# Patient Record
Sex: Male | Born: 1990 | Race: White | Hispanic: No | Marital: Married | Smoking: Former smoker
Health system: Southern US, Community
[De-identification: ages and names within clinical notes are randomized; demographics above are authoritative.]

## PROBLEM LIST (undated history)

## (undated) DIAGNOSIS — T7840XA Allergy, unspecified, initial encounter: Secondary | ICD-10-CM

## (undated) DIAGNOSIS — F419 Anxiety disorder, unspecified: Secondary | ICD-10-CM

## (undated) HISTORY — DX: Allergy, unspecified, initial encounter: T78.40XA

---
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
## 2014-01-26 ENCOUNTER — Telehealth: Payer: Self-pay | Admitting: Family Medicine

## 2014-01-27 NOTE — Telephone Encounter (Signed)
Appt given per patient request 

## 2014-03-31 ENCOUNTER — Ambulatory Visit: Payer: Self-pay | Admitting: Family Medicine

## 2015-12-04 ENCOUNTER — Ambulatory Visit (INDEPENDENT_AMBULATORY_CARE_PROVIDER_SITE_OTHER): Payer: BC Managed Care – PPO | Admitting: Family Medicine

## 2015-12-04 ENCOUNTER — Encounter (INDEPENDENT_AMBULATORY_CARE_PROVIDER_SITE_OTHER): Payer: Self-pay

## 2015-12-04 ENCOUNTER — Encounter: Payer: Self-pay | Admitting: Family Medicine

## 2015-12-04 VITALS — BP 114/71 | HR 68 | Temp 97.4°F | Ht 69.0 in | Wt 226.1 lb

## 2015-12-04 DIAGNOSIS — IMO0001 Reserved for inherently not codable concepts without codable children: Secondary | ICD-10-CM

## 2015-12-04 DIAGNOSIS — Z0000 Encounter for general adult medical examination without abnormal findings: Secondary | ICD-10-CM | POA: Diagnosis not present

## 2015-12-04 DIAGNOSIS — B359 Dermatophytosis, unspecified: Secondary | ICD-10-CM

## 2015-12-04 DIAGNOSIS — E663 Overweight: Secondary | ICD-10-CM

## 2015-12-04 LAB — URINALYSIS
BILIRUBIN UA: NEGATIVE
Glucose, UA: NEGATIVE
Ketones, UA: NEGATIVE
Leukocytes, UA: NEGATIVE
Nitrite, UA: NEGATIVE
PH UA: 7 (ref 5.0–7.5)
Protein, UA: NEGATIVE
RBC UA: NEGATIVE
SPEC GRAV UA: 1.02 (ref 1.005–1.030)
UUROB: 0.2 mg/dL (ref 0.2–1.0)

## 2015-12-04 MED ORDER — ECONAZOLE NITRATE 1 % EX CREA: TOPICAL_CREAM | Freq: Every day | CUTANEOUS | 0 refills | Status: DC

## 2015-12-04 NOTE — Patient Instructions (Signed)
It was great to meet you today. Thanks for coming in.  Remember to get to lose it app for your phone. Write down all of the food sheet every day and it will help you count your calories.  In 1 year we should repeat your physical. By then I'd like to see your weight at 185. You can accomplish this through multiple things we discussed such as increasing protein in your diet by eating more eggs whites in the morning. You can have [place] yogurt. Look for protein bars that have at least 1 g of protein for every 10 cal total. Avoid eating close to bedtime as much as possible. Try exercising for at least 1 hour 5 times a week. I'll of the fact that you are active in the gym. Lifting weights is great for young guys to trim down. You do need to add more cardio though.

## 2015-12-04 NOTE — Progress Notes (Signed)
Subjective:  Patient ID: Jackson Mitchell, male    DOB: 12-02-1990  Age: 25 y.o. MRN: [id number]  CC: New Patient (Initial Visit) (pt here to establish care and would like bloodwork because he has been told in the past his cholesterol levels were borderline high.)   HPI Farah Benish presents for CPE  History Lunden has a past medical history of Allergy.   He has no past surgical history on file.   His family history includes Alcohol abuse in his maternal grandmother; Arthritis in his father, maternal grandfather, and maternal grandmother; COPD in his father; Cancer in his maternal grandfather; Depression in his mother; Hyperlipidemia in his father and mother; Hypertension in his father.He reports that he quit smoking about 4 years ago. He quit smokeless tobacco use about 4 years ago. He reports that he drinks about 1.8 oz of alcohol per week . He reports that he does not use drugs.  No current outpatient prescriptions on file prior to visit.   No current facility-administered medications on file prior to visit.     ROS Review of Systems  Constitutional: Negative for activity change, appetite change, chills, diaphoresis, fatigue, fever and unexpected weight change.  HENT: Negative for congestion, ear pain, hearing loss, postnasal drip, rhinorrhea, sore throat, tinnitus and trouble swallowing.   Eyes: Negative for photophobia, pain, discharge and redness.  Respiratory: Negative for apnea, cough, choking, chest tightness, shortness of breath, wheezing and stridor.   Cardiovascular: Negative for chest pain, palpitations and leg swelling.  Gastrointestinal: Negative for abdominal distention, abdominal pain, blood in stool, constipation, diarrhea, nausea and vomiting.  Endocrine: Negative for cold intolerance, heat intolerance, polydipsia, polyphagia and polyuria.  Genitourinary: Negative for difficulty urinating, dysuria, enuresis, flank pain, frequency, genital sores, hematuria and  urgency.  Musculoskeletal: Negative for arthralgias and joint swelling.  Skin: Negative for color change, rash and wound.  Allergic/Immunologic: Negative for immunocompromised state.  Neurological: Negative for dizziness, tremors, seizures, syncope, facial asymmetry, speech difficulty, weakness, light-headedness, numbness and headaches.  Hematological: Does not bruise/bleed easily.  Psychiatric/Behavioral: Negative for agitation, behavioral problems, confusion, decreased concentration, dysphoric mood, hallucinations, sleep disturbance and suicidal ideas. The patient is not nervous/anxious and is not hyperactive.     Objective:  BP 114/71   Pulse 68   Temp 97.4 F (36.3 C) (Oral)   Ht '5\' 9"'  (1.753 m)   Wt 226 lb 2 oz (102.6 kg)   BMI 33.39 kg/m   Physical Exam  Constitutional: He is oriented to person, place, and time. He appears well-developed and well-nourished.  HENT:  Head: Normocephalic and atraumatic.  Mouth/Throat: Oropharynx is clear and moist.  Eyes: EOM are normal. Pupils are equal, round, and reactive to light.  Neck: Normal range of motion. No tracheal deviation present. No thyromegaly present.  Cardiovascular: Normal rate, regular rhythm and normal heart sounds.  Exam reveals no gallop and no friction rub.   No murmur heard. Pulmonary/Chest: Breath sounds normal. He has no wheezes. He has no rales.  Abdominal: Soft. He exhibits no mass. There is no tenderness.  Musculoskeletal: Normal range of motion. He exhibits no edema.  Neurological: He is alert and oriented to person, place, and time.  Skin: Skin is warm and dry. There is erythema (both cheeks, without telangectasia minimal papularity).  Psychiatric: He has a normal mood and affect.    Assessment & Plan:   Arjun was seen today for new patient (initial visit).  Diagnoses and all orders for this visit:  Well adult -  CBC with Differential/Platelet -     CMP14+EGFR -     Lipid panel -      Urinalysis  Overweight -     CBC with Differential/Platelet -     CMP14+EGFR -     Lipid panel -     Urinalysis -     TSH  Trichophyton rubrum causing tinea  Other orders -     econazole nitrate 1 % cream; Apply topically daily.   I am having Mr. Dirk start on econazole nitrate. I am also having him maintain his loratadine.  Meds ordered this encounter  Medications  . loratadine (CLARITIN) 10 MG tablet    Sig: Take 10 mg by mouth daily.  Marland Kitchen econazole nitrate 1 % cream    Sig: Apply topically daily.    Dispense:  15 g    Refill:  0   Remember to get to lose it app for your phone. Write down all of the food sheet every day and it will help you count your calories.  In 1 year we should repeat your physical. By then I'd like to see your weight at 185. You can accomplish this through multiple things we discussed such as increasing protein in your diet by eating more eggs whites in the morning. You can have [place] yogurt. Look for protein bars that have at least 1 g of protein for every 10 cal total. Avoid eating close to bedtime as much as possible. Try exercising for at least 1 hour 5 times a week. I'll of the fact that you are active in the gym. Lifting weights is great for young guys to trim down. You do need to add more cardio though.  Follow-up: Return in about 1 year (around 12/03/2016).  Claretta Fraise, M.D.

## 2015-12-05 LAB — CMP14+EGFR
A/G RATIO: 1.6 (ref 1.2–2.2)
ALT: 46 IU/L — ABNORMAL HIGH (ref 0–44)
AST: 27 IU/L (ref 0–40)
Albumin: 4.5 g/dL (ref 3.5–5.5)
Alkaline Phosphatase: 125 IU/L — ABNORMAL HIGH (ref 39–117)
BILIRUBIN TOTAL: 0.2 mg/dL (ref 0.0–1.2)
BUN/Creatinine Ratio: 13 (ref 9–20)
BUN: 12 mg/dL (ref 6–20)
CHLORIDE: 102 mmol/L (ref 96–106)
CO2: 26 mmol/L (ref 18–29)
CREATININE: 0.94 mg/dL (ref 0.76–1.27)
Calcium: 9.5 mg/dL (ref 8.7–10.2)
GFR calc Af Amer: 132 mL/min/{1.73_m2} (ref >59)
GFR calc non Af Amer: 114 mL/min/{1.73_m2} (ref >59)
GLOBULIN, TOTAL: 2.8 g/dL (ref 1.5–4.5)
Glucose: 89 mg/dL (ref 65–99)
POTASSIUM: 4.9 mmol/L (ref 3.5–5.2)
SODIUM: 142 mmol/L (ref 134–144)
Total Protein: 7.3 g/dL (ref 6.0–8.5)

## 2015-12-05 LAB — CBC WITH DIFFERENTIAL/PLATELET
Basophils Absolute: 0 10*3/uL (ref 0.0–0.2)
Basos: 0 %
EOS (ABSOLUTE): 0.1 10*3/uL (ref 0.0–0.4)
EOS: 1 %
HEMATOCRIT: 44.3 % (ref 37.5–51.0)
Hemoglobin: 14.5 g/dL (ref 12.6–17.7)
Immature Grans (Abs): 0 10*3/uL (ref 0.0–0.1)
Immature Granulocytes: 0 %
LYMPHS ABS: 3 10*3/uL (ref 0.7–3.1)
LYMPHS: 24 %
MCH: 27.1 pg (ref 26.6–33.0)
MCHC: 32.7 g/dL (ref 31.5–35.7)
MCV: 83 fL (ref 79–97)
MONOCYTES: 6 %
MONOS ABS: 0.8 10*3/uL (ref 0.1–0.9)
NEUTROS ABS: 8.7 10*3/uL — AB (ref 1.4–7.0)
Neutrophils: 69 %
Platelets: 348 10*3/uL (ref 150–379)
RBC: 5.35 x10E6/uL (ref 4.14–5.80)
RDW: 14.6 % (ref 12.3–15.4)
WBC: 12.7 10*3/uL — AB (ref 3.4–10.8)

## 2015-12-05 LAB — LIPID PANEL
CHOLESTEROL TOTAL: 168 mg/dL (ref 100–199)
Chol/HDL Ratio: 3.7 ratio units (ref 0.0–5.0)
HDL: 45 mg/dL (ref >39)
LDL Calculated: 111 mg/dL — ABNORMAL HIGH (ref 0–99)
TRIGLYCERIDES: 59 mg/dL (ref 0–149)
VLDL Cholesterol Cal: 12 mg/dL (ref 5–40)

## 2015-12-05 LAB — TSH: TSH: 1.55 u[IU]/mL (ref 0.450–4.500)

## 2015-12-28 ENCOUNTER — Ambulatory Visit (INDEPENDENT_AMBULATORY_CARE_PROVIDER_SITE_OTHER): Payer: BC Managed Care – PPO | Admitting: *Deleted

## 2015-12-28 DIAGNOSIS — Z23 Encounter for immunization: Secondary | ICD-10-CM

## 2015-12-28 NOTE — Progress Notes (Signed)
Pt given Tdap Tolerated well 

## 2016-03-17 HISTORY — PX: WISDOM TOOTH EXTRACTION: SHX21

## 2016-04-04 ENCOUNTER — Ambulatory Visit (INDEPENDENT_AMBULATORY_CARE_PROVIDER_SITE_OTHER): Payer: BC Managed Care – PPO | Admitting: Physician Assistant

## 2016-04-04 ENCOUNTER — Encounter: Payer: Self-pay | Admitting: Physician Assistant

## 2016-04-04 VITALS — BP 124/89 | HR 86 | Temp 98.0°F | Ht 69.0 in | Wt 225.0 lb

## 2016-04-04 DIAGNOSIS — R52 Pain, unspecified: Secondary | ICD-10-CM

## 2016-04-04 DIAGNOSIS — J0110 Acute frontal sinusitis, unspecified: Secondary | ICD-10-CM | POA: Diagnosis not present

## 2016-04-04 DIAGNOSIS — J029 Acute pharyngitis, unspecified: Secondary | ICD-10-CM

## 2016-04-04 LAB — VERITOR FLU A/B WAIVED
Influenza A: NEGATIVE
Influenza B: NEGATIVE

## 2016-04-04 LAB — RAPID STREP SCREEN (MED CTR MEBANE ONLY): STREP GP A AG, IA W/REFLEX: NEGATIVE

## 2016-04-04 LAB — CULTURE, GROUP A STREP

## 2016-04-04 MED ORDER — AZITHROMYCIN 250 MG PO TABS: ORAL_TABLET | ORAL | 0 refills | Status: DC

## 2016-04-04 NOTE — Patient Instructions (Signed)
sinusi Sinusitis, Adult Sinusitis is soreness and inflammation of your sinuses. Sinuses are hollow spaces in the bones around your face. They are located:  Around your eyes.  In the middle of your forehead.  Behind your nose.  In your cheekbones. Your sinuses and nasal passages are lined with a stringy fluid (mucus). Mucus normally drains out of your sinuses. When your nasal tissues get inflamed or swollen, the mucus can get trapped or blocked so air cannot flow through your sinuses. This lets bacteria, viruses, and funguses grow, and that leads to infection. Follow these instructions at home: Medicines  Take, use, or apply over-the-counter and prescription medicines only as told by your doctor. These may include nasal sprays.  If you were prescribed an antibiotic medicine, take it as told by your doctor. Do not stop taking the antibiotic even if you start to feel better. Hydrate and Humidify  Drink enough water to keep your pee (urine) clear or pale yellow.  Use a cool mist humidifier to keep the humidity level in your home above 50%.  Breathe in steam for 10-15 minutes, 3-4 times a day or as told by your doctor. You can do this in the bathroom while a hot shower is running.  Try not to spend time in cool or dry air. Rest  Rest as much as possible.  Sleep with your head raised (elevated).  Make sure to get enough sleep each night. General instructions  Put a warm, moist washcloth on your face 3-4 times a day or as told by your doctor. This will help with discomfort.  Wash your hands often with soap and water. If there is no soap and water, use hand sanitizer.  Do not smoke. Avoid being around people who are smoking (secondhand smoke).  Keep all follow-up visits as told by your doctor. This is important. Contact a doctor if:  You have a fever.  Your symptoms get worse.  Your symptoms do not get better within 10 days. Get help right away if:  You have a very bad  headache.  You cannot stop throwing up (vomiting).  You have pain or swelling around your face or eyes.  You have trouble seeing.  You feel confused.  Your neck is stiff.  You have trouble breathing. This information is not intended to replace advice given to you by your health care provider. Make sure you discuss any questions you have with your health care provider. Document Released: 08/20/2007 Document Revised: 10/28/2015 Document Reviewed: 12/27/2014 Elsevier Interactive Patient Education  2017 [employer]

## 2016-04-04 NOTE — Progress Notes (Signed)
         Jackson EdelmanBradley Mitchell is a 26 y.o. male who presents for evaluation of sinus pain. Symptoms include: congestion, cough, facial pain, post nasal drip and sinus pressure. Onset of symptoms was 4 days ago. Symptoms have been gradually improving since that time. Past history is significant for occasional episodes of bronchitis. Patient is a non-smoker.  The following portions of the patient's history were reviewed and updated as appropriate: allergies, current medications, past family history, past medical history, past social history, past surgical history and problem list.  Review of Systems Constitutional: positive for chills, fatigue and malaise Eyes: positive for redness Ears, nose, mouth, throat, and face: positive for ear drainage, nasal congestion, sore mouth, sore throat and voice change Respiratory: positive for cough Cardiovascular: negative   Objective:    General appearance: mild distress Head: Normocephalic, without obvious abnormality, atraumatic Eyes: conjunctivae/corneas clear. PERRL, EOM's intact. Fundi benign. Ears: abnormal TM right ear - serous middle ear fluid Nose: Nares normal. Septum midline. Mucosa normal. No drainage or sinus tenderness., mild congestion, turbinates swollen, sinus tenderness bilateral Throat: abnormal findings: moderate oropharyngeal erythema Lungs: clear to auscultation bilaterally and normal percussion bilaterally Chest wall: no tenderness Heart: regular rate and rhythm, S1, S2 normal, no murmur, click, rub or gallop    Assessment:    Acute bacterial sinusitis.    Plan:  1. Body aches - Veritor Flu A/B Waived  2. Sore throat - Rapid strep screen (not at [facility])  3. Acute non-recurrent frontal sinusitis Zithromax #1 as directed   Nasal saline sprays. Nasal steroids per medication orders.

## 2016-05-28 ENCOUNTER — Encounter: Payer: Self-pay | Admitting: Family Medicine

## 2016-05-28 ENCOUNTER — Ambulatory Visit (INDEPENDENT_AMBULATORY_CARE_PROVIDER_SITE_OTHER): Payer: BC Managed Care – PPO | Admitting: Family Medicine

## 2016-05-28 VITALS — BP 138/85 | HR 76 | Temp 97.9°F | Ht 69.0 in | Wt 214.4 lb

## 2016-05-28 DIAGNOSIS — G5702 Lesion of sciatic nerve, left lower limb: Secondary | ICD-10-CM

## 2016-05-28 MED ORDER — PREDNISONE 20 MG PO TABS: ORAL_TABLET | ORAL | 0 refills | Status: DC

## 2016-05-28 MED ORDER — CYCLOBENZAPRINE HCL 10 MG PO TABS: 10.0000 mg | ORAL_TABLET | Freq: Three times a day (TID) | ORAL | 0 refills | Status: DC | PRN

## 2016-05-28 NOTE — Progress Notes (Signed)
BP 138/85   Pulse 76   Temp 97.9 F (36.6 C) (Oral)   Ht 5\' 9"  (1.753 m)   Wt 214 lb 6 oz (97.2 kg)   BMI 31.66 kg/m    Subjective:    Patient ID: Jackson Mitchell, male    DOB: 08/03/1990, 26 y.o.   MRN: [id number]  HPI: Jackson Mitchell is a 26 y.o. male presenting on 05/28/2016 for Back Pain (lower back pain, radiating into left leg; began 2 weeks ago)   HPI Low back pain and left hamstring pain Patient has low back pain and left hamstring pain that has been going on for the past 2 weeks. It started in his lower back but now is worse on his left lateral hamstring. He denies any fevers or chills or numbness or weakness. The pain has not been worsening but he feels like it is not improving either. He has been using Advil and Aleve which has been helping some. He denies any numbness or weakness.  Relevant past medical, surgical, family and social history reviewed and updated as indicated. Interim medical history since our last visit reviewed. Allergies and medications reviewed and updated.  Review of Systems  Constitutional: Negative for chills and fever.  Respiratory: Negative for shortness of breath and wheezing.   Cardiovascular: Negative for chest pain and leg swelling.  Gastrointestinal: Negative for abdominal pain.  Musculoskeletal: Positive for back pain and myalgias. Negative for gait problem.  Skin: Negative for rash.  All other systems reviewed and are negative.   Per HPI unless specifically indicated above   Allergies as of 05/28/2016      Reactions   Penicillins Rash      Medication List       Accurate as of 05/28/16  1:00 PM. Always use your most recent med list.          cyclobenzaprine 10 MG tablet Commonly known as:  FLEXERIL Take 1 tablet (10 mg total) by mouth 3 (three) times daily as needed for muscle spasms.   econazole nitrate 1 % cream Apply topically daily.   ibuprofen 400 MG tablet Commonly known as:  ADVIL,MOTRIN Take 400 mg by mouth every  6 (six) hours as needed.   loratadine 10 MG tablet Commonly known as:  CLARITIN Take 10 mg by mouth daily.   predniSONE 20 MG tablet Commonly known as:  DELTASONE 2 po at same time daily for 5 days          Objective:    BP 138/85   Pulse 76   Temp 97.9 F (36.6 C) (Oral)   Ht 5\' 9"  (1.753 m)   Wt 214 lb 6 oz (97.2 kg)   BMI 31.66 kg/m   Wt Readings from Last 3 Encounters:  05/28/16 214 lb 6 oz (97.2 kg)  04/04/16 225 lb (102.1 kg)  12/04/15 226 lb 2 oz (102.6 kg)    Physical Exam  Constitutional: He is oriented to person, place, and time. He appears well-developed and well-nourished. No distress.  Eyes: Conjunctivae are normal. Right eye exhibits no discharge. No scleral icterus.  Musculoskeletal: Normal range of motion. He exhibits no edema.       Lumbar back: He exhibits normal range of motion, no tenderness, no bony tenderness, no swelling and no edema.       Legs: Neurological: He is alert and oriented to person, place, and time. Coordination normal.  Skin: Skin is warm and dry. No rash noted. He is not diaphoretic.  Psychiatric:  He has a normal mood and affect. His behavior is normal.  Nursing note and vitals reviewed.     Assessment & Plan:   Problem List Items Addressed This Visit    None    Visit Diagnoses    Sciatic nerve disease, left    -  Primary   possible started as sciatica and ended up as IT band syndrome   Relevant Medications   predniSONE (DELTASONE) 20 MG tablet   cyclobenzaprine (FLEXERIL) 10 MG tablet       Follow up plan: Return if symptoms worsen or fail to improve.  Counseling provided for all of the vaccine components No orders of the defined types were placed in this encounter.   Arville Care, MD [facility] 05/28/2016, 1:00 PM

## 2016-06-06 ENCOUNTER — Encounter: Payer: Self-pay | Admitting: Family Medicine

## 2016-06-06 MED ORDER — PREDNISONE 20 MG PO TABS: ORAL_TABLET | ORAL | 0 refills | Status: DC

## 2016-07-02 ENCOUNTER — Encounter: Payer: Self-pay | Admitting: Family Medicine

## 2016-07-02 ENCOUNTER — Ambulatory Visit (INDEPENDENT_AMBULATORY_CARE_PROVIDER_SITE_OTHER): Payer: BC Managed Care – PPO

## 2016-07-02 ENCOUNTER — Ambulatory Visit (INDEPENDENT_AMBULATORY_CARE_PROVIDER_SITE_OTHER): Payer: BC Managed Care – PPO | Admitting: Family Medicine

## 2016-07-02 VITALS — BP 128/88 | HR 69 | Temp 97.9°F | Ht 68.25 in | Wt 215.0 lb

## 2016-07-02 DIAGNOSIS — Z0000 Encounter for general adult medical examination without abnormal findings: Secondary | ICD-10-CM

## 2016-07-02 DIAGNOSIS — Z111 Encounter for screening for respiratory tuberculosis: Secondary | ICD-10-CM | POA: Diagnosis not present

## 2016-07-02 DIAGNOSIS — E669 Obesity, unspecified: Secondary | ICD-10-CM | POA: Insufficient documentation

## 2016-07-02 DIAGNOSIS — M5442 Lumbago with sciatica, left side: Secondary | ICD-10-CM

## 2016-07-02 DIAGNOSIS — Z6831 Body mass index (BMI) 31.0-31.9, adult: Secondary | ICD-10-CM

## 2016-07-02 LAB — URINALYSIS
Bilirubin, UA: NEGATIVE
GLUCOSE, UA: NEGATIVE
KETONES UA: NEGATIVE
LEUKOCYTES UA: NEGATIVE
Nitrite, UA: NEGATIVE
PROTEIN UA: NEGATIVE
RBC, UA: NEGATIVE
Specific Gravity, UA: 1.02 (ref 1.005–1.030)
Urobilinogen, Ur: 0.2 mg/dL (ref 0.2–1.0)
pH, UA: 5 (ref 5.0–7.5)

## 2016-07-02 NOTE — Progress Notes (Signed)
BP 128/88   Pulse 69   Temp 97.9 F (36.6 C) (Oral)   Ht 5' 8.25" (1.734 m)   Wt 215 lb (97.5 kg)   BMI 32.45 kg/m    Subjective:    Patient ID: Jackson Mitchell, male    DOB: [birth year], 26 y.o.   MRN: [id number]  HPI: Jackson Mitchell is a 26 y.o. 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 He denies any major health issues except the low back pain that he was having previously that does radiate down his left leg, he denies any numbness or weakness. It only radiates down to just behind the knee. Patient denies any chest pain, shortness of breath, headaches or vision issues, abdominal complaints, diarrhea, nausea, vomiting, or joint issues.   Relevant past medical, surgical, family and social history reviewed and updated as indicated. Interim medical history since our last visit reviewed. Allergies and medications reviewed and updated.  Review of Systems  Constitutional: Negative for chills and fever.  HENT: Negative for ear pain and tinnitus.   Eyes: Negative for pain and discharge.  Respiratory: Negative for cough, shortness of breath and wheezing.   Cardiovascular: Negative for chest pain, palpitations and leg swelling.  Gastrointestinal: Negative for abdominal pain, blood in stool, constipation and diarrhea.  Genitourinary: Negative for dysuria and hematuria.  Musculoskeletal: Positive for back pain. Negative for gait problem and myalgias.  Skin: Negative for rash.  Neurological: Negative for dizziness, weakness, numbness and headaches.  Psychiatric/Behavioral: Negative for suicidal ideas.  All other systems reviewed and are negative.   Per HPI unless specifically indicated above   Allergies as of 07/02/2016      Reactions   Penicillins Rash      Medication List       Accurate as  of 07/02/16 11:20 AM. Always use your most recent med list.          cyclobenzaprine 10 MG tablet Commonly known as:  FLEXERIL Take 1 tablet (10 mg total) by mouth 3 (three) times daily as needed for muscle spasms.   econazole nitrate 1 % cream Apply topically daily.   ibuprofen 400 MG tablet Commonly known as:  ADVIL,MOTRIN Take 400 mg by mouth every 6 (six) hours as needed.   loratadine 10 MG tablet Commonly known as:  CLARITIN Take 10 mg by mouth daily.          Objective:    BP 128/88   Pulse 69   Temp 97.9 F (36.6 C) (Oral)   Ht 5' 8.25" (1.734 m)   Wt 215 lb (97.5 kg)   BMI 32.45 kg/m   Wt Readings from Last 3 Encounters:  07/02/16 215 lb (97.5 kg)  05/28/16 214 lb 6 oz (97.2 kg)  04/04/16 225 lb (102.1 kg)    Physical Exam  Constitutional: He is oriented to person, place, and time. He appears well-developed and well-nourished. No distress.  HENT:  Right Ear: External ear normal.  Left Ear: External ear normal.  Nose: Nose normal.  Mouth/Throat: Oropharynx is clear and moist. No oropharyngeal exudate.  Eyes: Conjunctivae and EOM are normal. Pupils are equal, round, and reactive to light. Right eye exhibits no discharge. No scleral icterus.  Neck: Neck supple. No thyromegaly present.  Cardiovascular: Normal rate, regular rhythm, normal heart sounds and  intact distal pulses.   No murmur heard. Pulmonary/Chest: Effort normal and breath sounds normal. No respiratory distress. He has no wheezes.  Abdominal: Soft. Bowel sounds are normal. He exhibits no distension. There is no tenderness. There is no rebound and no guarding.  Musculoskeletal: Normal range of motion. He exhibits no edema.       Back:  Lymphadenopathy:    He has no cervical adenopathy.  Neurological: He is alert and oriented to person, place, and time. Coordination normal.  Skin: Skin is warm and dry. No rash noted. He is not diaphoretic.  Psychiatric: He has a normal mood and affect. His  behavior is normal.  Vitals reviewed.   Lumbar x-ray: No signs of narrowing or acute bony abnormalities, with final read by radiologist.  Urine dip: Normal    Assessment & Plan:   Problem List Items Addressed This Visit      Other   Class 1 obesity without serious comorbidity with body mass index (BMI) of 31.0 to 31.9 in adult    Other Visit Diagnoses    Well adult exam    -  Primary   Physical for work, requires urinalysis   Relevant Orders   Urinalysis   Acute left-sided low back pain with left-sided sciatica       Relevant Orders   DG Lumbar Spine 2-3 Views   Encounter for TB tine test       Relevant Orders   PPD (Completed)       Follow up plan: Return if symptoms worsen or fail to improve.  Counseling provided for all of the vaccine components Orders Placed This Encounter  Procedures  . DG Lumbar Spine 2-3 Views  . Urinalysis    Arville Care, MD [facility] 07/02/2016, 11:20 AM

## 2016-07-14 ENCOUNTER — Encounter: Payer: Self-pay | Admitting: Family Medicine

## 2016-07-30 ENCOUNTER — Other Ambulatory Visit: Payer: Self-pay

## 2016-07-30 ENCOUNTER — Telehealth: Payer: Self-pay | Admitting: Family Medicine

## 2016-07-30 DIAGNOSIS — M5440 Lumbago with sciatica, unspecified side: Secondary | ICD-10-CM

## 2016-07-30 NOTE — Telephone Encounter (Signed)
Detailed message left for pt

## 2016-08-07 ENCOUNTER — Ambulatory Visit (HOSPITAL_COMMUNITY): Payer: BC Managed Care – PPO

## 2016-09-11 ENCOUNTER — Encounter: Payer: Self-pay | Admitting: Family Medicine

## 2016-09-11 MED ORDER — PREDNISONE 20 MG PO TABS: ORAL_TABLET | ORAL | 0 refills | Status: DC

## 2016-09-11 MED ORDER — MELOXICAM 15 MG PO TABS: 15.0000 mg | ORAL_TABLET | Freq: Every day | ORAL | 1 refills | Status: DC

## 2016-09-11 NOTE — Telephone Encounter (Signed)
Patient also low back pain, sent in prednisone and meloxicam for him, please do not start until prednisone is finished Arville CareJoshua Dettinger, MD [facility] 09/11/2016, 1:08 PM

## 2017-01-14 ENCOUNTER — Encounter: Payer: Self-pay | Admitting: Family Medicine

## 2017-01-14 MED ORDER — PREDNISONE 20 MG PO TABS: ORAL_TABLET | ORAL | 0 refills | Status: DC

## 2017-05-13 ENCOUNTER — Ambulatory Visit: Payer: BC Managed Care – PPO | Admitting: Pediatrics

## 2017-05-13 ENCOUNTER — Encounter: Payer: Self-pay | Admitting: Pediatrics

## 2017-05-13 VITALS — BP 123/84 | HR 88 | Temp 97.2°F | Ht 68.25 in | Wt 207.8 lb

## 2017-05-13 DIAGNOSIS — M5442 Lumbago with sciatica, left side: Secondary | ICD-10-CM | POA: Diagnosis not present

## 2017-05-13 DIAGNOSIS — G8929 Other chronic pain: Secondary | ICD-10-CM

## 2017-05-13 NOTE — Patient Instructions (Signed)
Back Exercises If you have pain in your back, do these exercises 2-3 times each day or as told by your doctor. When the pain goes away, do the exercises once each day, but repeat the steps more times for each exercise (do more repetitions). If you do not have pain in your back, do these exercises once each day or as told by your doctor. Exercises Single Knee to Chest  Do these steps 3-5 times in a row for each leg: 1. Lie on your back on a firm bed or the floor with your legs stretched out. 2. Bring one knee to your chest. 3. Hold your knee to your chest by grabbing your knee or thigh. 4. Pull on your knee until you feel a gentle stretch in your lower back. 5. Keep doing the stretch for 10-30 seconds. 6. Slowly let go of your leg and straighten it.  Pelvic Tilt  Do these steps 5-10 times in a row: 1. Lie on your back on a firm bed or the floor with your legs stretched out. 2. Bend your knees so they point up to the ceiling. Your feet should be flat on the floor. 3. Tighten your lower belly (abdomen) muscles to press your lower back against the floor. This will make your tailbone point up to the ceiling instead of pointing down to your feet or the floor. 4. Stay in this position for 5-10 seconds while you gently tighten your muscles and breathe evenly.  Cat-Cow  Do these steps until your lower back bends more easily: 1. Get on your hands and knees on a firm surface. Keep your hands under your shoulders, and keep your knees under your hips. You may put padding under your knees. 2. Let your head hang down, and make your tailbone point down to the floor so your lower back is round like the back of a cat. 3. Stay in this position for 5 seconds. 4. Slowly lift your head and make your tailbone point up to the ceiling so your back hangs low (sags) like the back of a cow. 5. Stay in this position for 5 seconds.  Press-Ups  Do these steps 5-10 times in a row: 1. Lie on your belly (face-down)  on the floor. 2. Place your hands near your head, about shoulder-width apart. 3. While you keep your back relaxed and keep your hips on the floor, slowly straighten your arms to raise the top half of your body and lift your shoulders. Do not use your back muscles. To make yourself more comfortable, you may change where you place your hands. 4. Stay in this position for 5 seconds. 5. Slowly return to lying flat on the floor.  Bridges  Do these steps 10 times in a row: 1. Lie on your back on a firm surface. 2. Bend your knees so they point up to the ceiling. Your feet should be flat on the floor. 3. Tighten your butt muscles and lift your butt off of the floor until your waist is almost as high as your knees. If you do not feel the muscles working in your butt and the back of your thighs, slide your feet 1-2 inches farther away from your butt. 4. Stay in this position for 3-5 seconds. 5. Slowly lower your butt to the floor, and let your butt muscles relax.  If this exercise is too easy, try doing it with your arms crossed over your chest. Back Lifts Do these steps 5-10 times in a   row: 1. Lie on your belly (face-down) with your arms at your sides, and rest your forehead on the floor. 2. Tighten the muscles in your legs and your butt. 3. Slowly lift your chest off of the floor while you keep your hips on the floor. Keep the back of your head in line with the curve in your back. Look at the floor while you do this. 4. Stay in this position for 3-5 seconds. 5. Slowly lower your chest and your face to the floor.  Contact a doctor if:  Your back pain gets a lot worse when you do an exercise.  Your back pain does not lessen 2 hours after you exercise. If you have any of these problems, stop doing the exercises. Do not do them again unless your doctor says it is okay. Get help right away if:  You have sudden, very bad back pain. If this happens, stop doing the exercises. Do not do them again  unless your doctor says it is okay. This information is not intended to replace advice given to you by your health care provider. Make sure you discuss any questions you have with your health care provider. Document Released: 04/05/2010 Document Revised: 08/09/2015 Document Reviewed: 04/27/2014 Elsevier Interactive Patient Education  2018 [employer]   

## 2017-05-13 NOTE — Progress Notes (Signed)
  Subjective:   Patient ID: Jackson Mitchell, male    DOB: 08/06/1990, 27 y.o.   MRN: [id number] CC: Back Pain (Lower, 1 year)  HPI: Jackson Mitchell is a 27 y.o. male presenting for Back Pain (Lower, 1 year)  Last year had mid back pain, tingling went down L leg, came and went for 3 months.   Tried on prednisone with some improvement. Flexeril didn't help. Recently taking ibuprofen on days off of work, 600mg  2-3 times a day. On work days takes 2 [employer] fast streamer[employer] aleve.   Has been seeing chiropractor regularly for past few months. Thinks helping some but not getting him back to normal. 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  Has a hard time getting out of bed in the morning because of stiffness in his back. Has not had the pain/tingling down L leg return. L hamstrings very tight.  Has been able to exercise regularly, less than several months ago with new baby at home, using treadmill, weight lifting.   Relevant past medical, surgical, family and social history reviewed. Allergies and medications reviewed and updated. Social History   Tobacco Use  Smoking Status Former Smoker  . Last attempt to quit: 12/04/2011  . Years since quitting: 5.4  Smokeless Tobacco Former NeurosurgeonUser  . Quit date: 12/04/2011   ROS: Per HPI   Objective:    BP 123/84   Pulse 88   Temp (!) 97.2 F (36.2 C) (Oral)   Ht 5' 8.25" (1.734 m)   Wt 207 lb 12.8 oz (94.3 kg)   BMI 31.36 kg/m   Wt Readings from Last 3 Encounters:  05/13/17 207 lb 12.8 oz (94.3 kg)  07/02/16 215 lb (97.5 kg)  05/28/16 214 lb 6 oz (97.2 kg)    Gen: NAD, alert, cooperative with exam, NCAT EYES: EOMI, no conjunctival injection, or no icterus CV: NRRR, normal S1/S2, no murmur, distal pulses 2+ b/l Resp: CTABL, no wheezes, normal WOB Abd: +BS, soft, NTND. no guarding or organomegaly Ext: No edema, warm Neuro: Alert and oriented, sensation intact b/l LE MSK: neg SLR b/l. Very tight  hamstring L leg. Strength 5/5 equal b/l knee ext/flex, hip flexion. No point tenderness over spine.  Assessment & Plan:  Jackson Mitchell was seen today for back pain.  Diagnoses and all orders for this visit:  Chronic midline low back pain with left-sided sciatica Rest, NSAIDs, ice, heat, gentle stretching, referral to PT.  -     Ambulatory referral to Physical Therapy -     Ambulatory referral to Orthopedic Surgery   Follow up plan: Return in about 2 months (around 07/11/2017). Rex Krasarol Vincent, MD [facility]

## 2017-05-20 ENCOUNTER — Ambulatory Visit: Payer: BC Managed Care – PPO | Admitting: Physical Therapy

## 2017-05-21 ENCOUNTER — Encounter: Payer: BC Managed Care – PPO | Admitting: Family Medicine

## 2017-05-25 ENCOUNTER — Encounter: Payer: Self-pay | Admitting: Physical Therapy

## 2017-05-25 ENCOUNTER — Ambulatory Visit (INDEPENDENT_AMBULATORY_CARE_PROVIDER_SITE_OTHER): Payer: BC Managed Care – PPO | Admitting: Orthopedic Surgery

## 2017-05-25 ENCOUNTER — Ambulatory Visit: Payer: BC Managed Care – PPO | Attending: Pediatrics | Admitting: Physical Therapy

## 2017-05-25 DIAGNOSIS — G8929 Other chronic pain: Secondary | ICD-10-CM | POA: Diagnosis present

## 2017-05-25 DIAGNOSIS — M5442 Lumbago with sciatica, left side: Secondary | ICD-10-CM | POA: Insufficient documentation

## 2017-05-25 DIAGNOSIS — M6281 Muscle weakness (generalized): Secondary | ICD-10-CM | POA: Insufficient documentation

## 2017-05-25 NOTE — Patient Instructions (Signed)
   Otniel Hoe, PT, DPT [facility] [address] [place], [place], [address] Phone: [phone]   Fax:  [phone]  

## 2017-05-25 NOTE — Therapy (Addendum)
[facility] [address] [place], [place], [address] Phone: [phone]   Fax:  [phone]  Physical Therapy Evaluation  Patient Details  Name: Jackson Jackson Mitchell MRN: [id number] Date of Birth: 1990-11-08 Referring Provider: Rex Kras   Encounter Date: 05/25/2017  PT End of Session - 05/25/17 0920    Visit Number  1    Number of Visits  18    Date for PT Re-Evaluation  07/06/17    Authorization Type  FOTO every 5th visit    PT Start Time  0817    PT Stop Time  0903    PT Time Calculation (min)  46 min    Activity Tolerance  Patient tolerated treatment well    Behavior During Therapy  [name] for tasks assessed/performed       Past Medical History:  Diagnosis Date  . Allergy    pt has a hx of taking allergy injections for 5 years. Last time he had injection was 2008.    History reviewed. No pertinent surgical history.  There were no vitals filed for this visit.   Subjective Assessment - 05/25/17 2047    Subjective  Patient reports ongoing low back pain and radiating pain down the L LE. Patient reported occurred during exercise one year ago. He was performing a front squat with about 85 pounds but did not feel pain until much later. He as been self treating with OTC medication and gentle stretching. Patient was going to chiropractor and felt it helped only for short term. Patient felt a need to seek further treatment when he coughed and  8/10 pain radiated from low back and down both legs. Patient reports decrease of pain with movement and OTC medication. Pain is at worst when getting out of bed, at the end of the day, and with prolonged sitting, 7/10. Patient received an MRI to which he will find out results on March 21st.  Patient's goals would be decrease pain, improve movement, improve strength, and have less difficulties with home activities.    Limitations  Sitting;House hold activities    Diagnostic tests  x-ray: normal; waiting  for MRI results    Patient Stated Goals  decrease pain and improve movement    Currently in Pain?  Yes    Pain Score  4     Pain Location  Back    Pain Orientation  Lower    Pain Descriptors / Indicators  Dull    Pain Type  Chronic pain    Pain Onset  More than a month ago    Pain Frequency  Intermittent    Aggravating Factors   sitting laying, rolling over    Pain Relieving Factors  activity    Effect of Pain on Daily Activities  difficult to stand up         [facility] PT Assessment - 05/25/17 0001      Assessment   Medical Diagnosis  Chronic midline low back pain with left-sided sciatica    Referring Provider  Rex Kras      Balance Screen   Has the patient fallen in the past 6 months  No    Has the patient had a decrease in activity level because of a fear of falling?   No    Is the patient reluctant to leave their home because of a fear of falling?   No      Home Public house manager residence    Living Arrangements  Spouse/significant other;Children    Type of Home  House      Prior Function   Level of Independence  Independent    Vocation  Full time employment    Brewing technologist      Observation/Other Assessments   Focus on Therapeutic Outcomes (FOTO)   31% limited      Sensation   Light Touch  Appears Intact      Posture/Postural Control   Posture Comments  sits with forward flexed posture with elbows resting on legs      ROM / Strength   AROM / PROM / Strength  AROM;Strength      AROM   Overall AROM   Deficits    Overall AROM Comments  finger tip to floor    AROM Assessment Site  Lumbar    Lumbar Flexion  23.5 in finger tip to floor    Lumbar - Right Side Bend  21 in    Lumbar - Left Side Bend  17in      Strength   Overall Strength  Within functional limits for tasks performed    Strength Assessment Site  Hip;Knee    Right/Left Hip  Right;Left    Right Hip Flexion  4+/5    Right Hip Extension   4-/5    Right Hip ABduction  4-/5    Left Hip Flexion  4/5    Left Hip Extension  3+/5 (+) Pain    Left Hip ABduction  4/5      Flexibility   Soft Tissue Assessment /Muscle Length  yes    Hamstrings  SLR R: 45 degrees, left: 30 degrees (+) reproduction of symptoms      Ambulation/Gait   Gait Pattern  Step-through pattern;Decreased trunk rotation;Wide base of support;Abducted- right    Gait Comments  Patient noted with hesitation to ambulate             Objective measurements completed on examination: See above findings.      [facility] Adult PT Treatment/Exercise - 05/25/17 0001      Exercises   Exercises  Lumbar      Lumbar Exercises: Aerobic   Stationary Bike  level 4, x10 minutes with emphasis on draw in             PT Education - 05/25/17 2053    Education provided  Yes    Education Details  hamstring stretch, piriformis stretch, sciatic nerve glides    Person(s) Educated  Patient    Methods  Explanation;Demonstration;Handout    Comprehension  Returned demonstration;Verbalized understanding          PT Long Term Goals - 05/25/17 2128      PT LONG TERM GOAL #1   Title  Patient will be independent with HEP    Time  6    Period  Weeks    Target Date  07/06/17      PT LONG TERM GOAL #2   Title  Patient will report no radiating pain down LEs to indicate no nerve irritation.    Time  6    Period  Weeks    Status  New    Target Date  07/06/17      PT LONG TERM GOAL #3   Title  Patient will improve LE stength to 5/5 to improve stability with ADLs and functional activities.    Time  6    Period  Weeks    Status  New  Target Date  07/06/17      PT LONG TERM GOAL #4   Title  Patient improve bilateral SLR to >45 degrees with no radiating pain symptoms to improve hamstring flexibility.    Time  6    Period  Weeks    Status  New    Target Date  07/06/17      PT LONG TERM GOAL #5   Title  Patient will improve lumbar AROM in all planes to [facility] with  no reports of pain or radiating symptoms to allow for ease of functional activities.    Time  6    Period  Weeks    Status  New    Target Date  07/06/17             Plan - 05/25/17 1610    Clinical Impression Statement  Patient is a 27 year old male who presents to physical therapy with low back pain with occasional radiating pain down both feet. Patient has decreased strength and hamstring flexibility. SLR was able to reproduce radiating pain down to posterior knee. Patient noted with decreased lumbar spine AROM with noted hesitancy to perform lumbar flexion. SI joint compression test was negative and leg length and ASIS were equal. Patient would benefit from skilled physical therapy to decrease pain, decrease radiating symptoms, improve strength, improve flexibility to return to PLOF.    Clinical Presentation  Stable    Clinical Decision Making  Low    Rehab Potential  Excellent    PT Frequency  3x / week    PT Duration  6 weeks    PT Treatment/Interventions  ADLs/Self Care Home Management;Cryotherapy;Electrical Stimulation;Ultrasound;Moist Heat;Traction;Therapeutic exercise;Therapeutic activities;Dry needling    PT Next Visit Plan  bike as warm up with draw in, core stabilization exercise and progress per tolerance, modalities PRN for pain relief    Consulted and Agree with Plan of Care  Patient       Patient will benefit from skilled therapeutic intervention in order to improve the following deficits and impairments:  Pain, Decreased range of motion, Impaired flexibility, Decreased strength  Visit Diagnosis: Chronic midline low back pain with left-sided sciatica - Plan: PT plan of care cert/re-cert  Muscle weakness (generalized) - Plan: PT plan of care cert/re-cert     Problem List Patient Active Problem List   Diagnosis Date Noted  . Class 1 obesity without serious comorbidity with body mass index (BMI) of 31.0 to 31.9 in adult 07/02/2016  . Environmental allergies  04/11/2011  . Allergic rhinitis 11/15/2007   Guss Bunde, PT, DPT 05/25/2017, 9:32 PM  [facility] [address] [place], [place], [address] Phone: [phone]   Fax:  [phone]  Name: Jackson Jackson Mitchell MRN: [id number] Date of Birth: Jul 28, 1990

## 2017-05-26 ENCOUNTER — Ambulatory Visit: Payer: BC Managed Care – PPO | Admitting: Physical Therapy

## 2017-05-26 DIAGNOSIS — M5442 Lumbago with sciatica, left side: Principal | ICD-10-CM

## 2017-05-26 DIAGNOSIS — G8929 Other chronic pain: Secondary | ICD-10-CM

## 2017-05-26 DIAGNOSIS — M6281 Muscle weakness (generalized): Secondary | ICD-10-CM

## 2017-05-26 NOTE — Therapy (Signed)
[facility] [address] [place], [place], [address] Phone: [phone]   Fax:  [phone]  Physical Therapy Treatment  Patient Details  Name: Jackson Mitchell MRN: [id number] Date of Birth: [birth year] Referring Provider: Rex Kras   Encounter Date: 05/26/2017  PT End of Session - 05/26/17 1437    Visit Number  2    Number of Visits  18    Date for PT Re-Evaluation  07/06/17    Authorization Type  FOTO every 5th visit    PT Start Time  1436    PT Stop Time  1518    PT Time Calculation (min)  42 min    Activity Tolerance  Patient tolerated treatment well    Behavior During Therapy  [facility] for tasks assessed/performed       Past Medical History:  Diagnosis Date  . Allergy    pt has a hx of taking allergy injections for 5 years. Last time he had injection was 2008.    No past surgical history on file.  There were no vitals filed for this visit.  Subjective Assessment - 05/26/17 1437    Subjective  Patient reported pain upon getting out of bed was not nearly as bad, 3/10. Patient also stated it took less time for it to "loosen up." Patient reported peforming HEP multiple times yesterday with noted improvement in hamstring flexibility.    Limitations  Sitting;House hold activities    Diagnostic tests  x-ray: normal; waiting for MRI results    Patient Stated Goals  decrease pain and improve movement    Currently in Pain?  Yes    Pain Score  1     Pain Location  Back    Pain Orientation  Lower    Pain Descriptors / Indicators  Dull    Pain Type  Chronic pain    Pain Onset  More than a month ago         [facility] PT Assessment - 05/26/17 0001      Assessment   Medical Diagnosis  Chronic midline low back pain with left-sided sciatica      Balance Screen   Has the patient fallen in the past 6 months  No    Has the patient had a decrease in activity level because of a fear of falling?   No    Is the patient reluctant to leave  their home because of a fear of falling?   No                  [facility] Adult PT Treatment/Exercise - 05/26/17 0001      Exercises   Exercises  Lumbar      Lumbar Exercises: Stretches   Lower Trunk Rotation  3 reps;30 seconds      Lumbar Exercises: Aerobic   Stationary Bike  level 4, x15 minutes with emphasis on draw in      Lumbar Exercises: Standing   Row  Strengthening;Both;20 reps    Row Limitations  Pink XTS    Shoulder Extension  Strengthening;20 reps;Theraband    Shoulder Extension Limitations  Pink XTS      Lumbar Exercises: Supine   Ab Set  20 reps;5 seconds    Bridge  20 reps;2 seconds;Compliant    Straight Leg Raise  20 reps      Lumbar Exercises: Sidelying   Hip Abduction  Both;20 reps;2 seconds             PT Education -  05/25/17 2053    Education provided  Yes    Education Details  hamstring stretch, piriformis stretch, sciatic nerve glides    Person(s) Educated  Patient    Methods  Explanation;Demonstration;Handout    Comprehension  Returned demonstration;Verbalized understanding          PT Long Term Goals - 05/25/17 2128      PT LONG TERM GOAL #1   Title  Patient will be independent with HEP    Time  6    Period  Weeks    Target Date  07/06/17      PT LONG TERM GOAL #2   Title  Patient will report no radiating pain down LEs to indicate no nerve irritation.    Time  6    Period  Weeks    Status  New    Target Date  07/06/17      PT LONG TERM GOAL #3   Title  Patient will improve LE stength to 5/5 to improve stability with ADLs and functional activities.    Time  6    Period  Weeks    Status  New    Target Date  07/06/17      PT LONG TERM GOAL #4   Title  Patient improve bilateral SLR to >45 degrees with no radiating pain symptoms to improve hamstring flexibility.    Time  6    Period  Weeks    Status  New    Target Date  07/06/17      PT LONG TERM GOAL #5   Title  Patient will improve lumbar AROM in all planes to  [facility] with no reports of pain or radiating symptoms to allow for ease of functional activities.    Time  6    Period  Weeks    Status  New    Target Date  07/06/17            Plan - 05/26/17 1757    Clinical Impression Statement  Patient was able to complete exercises with no increase of pain. Patient noted with limited SLR ROM secondary to pain and "pulling" but as patient progressed with repetitions, patient was able to increase ROM with no pulling/pain. Patient instructed to add lower trunk rotation to stretch out L QL to HEP. Patient in agreement. Patient stated feeling "looser" with less discomfort in back after session.     Clinical Presentation  Stable    Clinical Decision Making  Low    Rehab Potential  Excellent    PT Frequency  3x / week    PT Duration  6 weeks    PT Treatment/Interventions  ADLs/Self Care Home Management;Cryotherapy;Electrical Stimulation;Ultrasound;Moist Heat;Traction;Therapeutic exercise;Therapeutic activities;Dry needling    PT Next Visit Plan  hamstring stretching, core stablization exercises per patient tolerance. modalities PRN for pain relief.    Consulted and Agree with Plan of Care  Patient       Patient will benefit from skilled therapeutic intervention in order to improve the following deficits and impairments:  Pain, Decreased range of motion, Impaired flexibility, Decreased strength  Visit Diagnosis: Chronic midline low back pain with left-sided sciatica  Muscle weakness (generalized)     Problem List Patient Active Problem List   Diagnosis Date Noted  . Class 1 obesity without serious comorbidity with body mass index (BMI) of 31.0 to 31.9 in adult 07/02/2016  . Environmental allergies 04/11/2011  . Allergic rhinitis 11/15/2007   Guss Bunde, PT, DPT 05/26/2017, 6:07 PM  [facility] [address] [place], [place], [address] Phone: [phone]   Fax:  [phone]  Name: Jackson EdelmanBradley  Mitchell MRN: [id number] Date of Birth: 06/08/1990

## 2017-06-03 ENCOUNTER — Ambulatory Visit: Payer: BC Managed Care – PPO | Admitting: Physical Therapy

## 2017-06-03 ENCOUNTER — Encounter: Payer: Self-pay | Admitting: Physical Therapy

## 2017-06-03 DIAGNOSIS — M6281 Muscle weakness (generalized): Secondary | ICD-10-CM

## 2017-06-03 DIAGNOSIS — G8929 Other chronic pain: Secondary | ICD-10-CM

## 2017-06-03 DIAGNOSIS — M5442 Lumbago with sciatica, left side: Principal | ICD-10-CM

## 2017-06-03 NOTE — Therapy (Addendum)
[facility] [place], [place], [address] Phone: [phone]   Fax:  [phone]  Physical Therapy Treatment/Discharge  Patient Details  Name: Jackson Mitchell MRN: [id number] Date of Birth: 1990-08-01 Referring Provider: Assunta Found   Encounter Date: 06/03/2017  PT End of Session - 06/03/17 0821    Visit Number  3    Number of Visits  18    Date for PT Re-Evaluation  07/06/17    Authorization Type  FOTO every 5th visit    PT Start Time  0816    PT Stop Time  0913    PT Time Calculation (min)  57 min    Activity Tolerance  Patient tolerated treatment well    Behavior During Therapy  [facility] for tasks assessed/performed       Past Medical History:  Diagnosis Date  . Allergy    pt has a hx of taking allergy injections for 5 years. Last time he had injection was 2008.    History reviewed. No pertinent surgical history.  There were no vitals filed for this visit.  Subjective Assessment - 06/03/17 0818    Subjective  Reports his back is tight and reports that he sneezed prior to leaving his home this morning and he felt pain. Upon waking pain was 6/10 and then afternoon his back tightens again.    Limitations  Sitting;House hold activities    Diagnostic tests  x-ray: normal; waiting for MRI results    Patient Stated Goals  decrease pain and improve movement    Currently in Pain?  Yes    Pain Score  4     Pain Location  Back    Pain Orientation  Lower    Pain Descriptors / Indicators  Tightness    Pain Type  Chronic pain    Pain Onset  More than a month ago         [facility] PT Assessment - 06/03/17 0001      Assessment   Medical Diagnosis  Chronic midline low back pain with left-sided sciatica    Next MD Visit  06/04/2017                  [facility] Adult PT Treatment/Exercise - 06/03/17 0001      Lumbar Exercises: Stretches   Lower Trunk Rotation  3 reps;20 seconds      Lumbar Exercises: Aerobic   Stationary Bike  L2 x15 min      Lumbar Exercises: Standing   Row  Strengthening;Both;20 reps    Row Limitations  Pin kXTs    Shoulder Extension  Strengthening;20 reps;Theraband    Shoulder Extension Limitations  Pink XTS with B staggered stance    Other Standing Lumbar Exercises  B forward lunges with 4# reachouts x15 reps each      Lumbar Exercises: Supine   Bridge  15 reps;2 seconds      Modalities   Modalities  Electrical Stimulation;Moist Heat      Moist Heat Therapy   Number Minutes Moist Heat  15 Minutes    Moist Heat Location  Lumbar Spine      Electrical Stimulation   Electrical Stimulation Location  L QL/ lumbar paraspinals    Electrical Stimulation Action  Pre-Mod    Electrical Stimulation Parameters  80-150 hz x15 min    Electrical Stimulation Goals  Pain                  PT Long Term Goals -  05/25/17 2128      PT LONG TERM GOAL #1   Title  Patient will be independent with HEP    Time  6    Period  Weeks    Target Date  07/06/17      PT LONG TERM GOAL #2   Title  Patient will report no radiating pain down LEs to indicate no nerve irritation.    Time  6    Period  Weeks    Status  New    Target Date  07/06/17      PT LONG TERM GOAL #3   Title  Patient will improve LE stength to 5/5 to improve stability with ADLs and functional activities.    Time  6    Period  Weeks    Status  New    Target Date  07/06/17      PT LONG TERM GOAL #4   Title  Patient improve bilateral SLR to >45 degrees with no radiating pain symptoms to improve hamstring flexibility.    Time  6    Period  Weeks    Status  New    Target Date  07/06/17      PT LONG TERM GOAL #5   Title  Patient will improve lumbar AROM in all planes to [facility] with no reports of pain or radiating symptoms to allow for ease of functional activities.    Time  6    Period  Weeks    Status  New    Target Date  07/06/17            Plan - 06/03/17 1122    Clinical Impression Statement   Patient tolerated today's treatment well as he was progressed to more functional strengthening with core activation. No complaints regarding any pain reported by patient other than quick sharp pain with initial bridges. LTR completed to assist with muscle tightness in low back. Normal modalities response noted following removal of the modalities.    Rehab Potential  Excellent    PT Frequency  3x / week    PT Duration  6 weeks    PT Treatment/Interventions  ADLs/Self Care Home Management;Cryotherapy;Electrical Stimulation;Ultrasound;Moist Heat;Traction;Therapeutic exercise;Therapeutic activities;Dry needling    PT Next Visit Plan  hamstring stretching, core stablization exercises per patient tolerance. modalities PRN for pain relief.    Consulted and Agree with Plan of Care  Patient       Patient will benefit from skilled therapeutic intervention in order to improve the following deficits and impairments:  Pain, Decreased range of motion, Impaired flexibility, Decreased strength  Visit Diagnosis: Chronic midline low back pain with left-sided sciatica  Muscle weakness (generalized)     Problem List Patient Active Problem List   Diagnosis Date Noted  . Class 1 obesity without serious comorbidity with body mass index (BMI) of 31.0 to 31.9 in adult 07/02/2016  . Environmental allergies 04/11/2011  . Allergic rhinitis 11/15/2007    PHYSICAL THERAPY DISCHARGE SUMMARY  Visits from Start of Care: 3  Current functional level related to goals / functional outcomes: See above   Remaining deficits: See goals   Education / Equipment: HEP  Plan: Patient agrees to discharge.  Patient goals were not met. Patient is being discharged due to not returning since the last visit.  ?????       Standley Mitchell, PTA 06/03/2017, 11:36 AM  [facility] [address] [place], [place], [address] Phone: [phone]   Fax:  [phone]  Name: Jackson Mitchell MRN: [id number] Date of Birth: Oct 21, 1990

## 2017-06-05 ENCOUNTER — Encounter: Payer: BC Managed Care – PPO | Admitting: *Deleted

## 2018-03-03 ENCOUNTER — Encounter: Payer: Self-pay | Admitting: Family Medicine

## 2018-03-03 ENCOUNTER — Ambulatory Visit (INDEPENDENT_AMBULATORY_CARE_PROVIDER_SITE_OTHER): Payer: BC Managed Care – PPO | Admitting: Family Medicine

## 2018-03-03 VITALS — BP 142/97 | HR 75 | Temp 97.0°F | Ht 68.25 in | Wt 215.8 lb

## 2018-03-03 DIAGNOSIS — Z23 Encounter for immunization: Secondary | ICD-10-CM

## 2018-03-03 DIAGNOSIS — E669 Obesity, unspecified: Secondary | ICD-10-CM

## 2018-03-03 DIAGNOSIS — R030 Elevated blood-pressure reading, without diagnosis of hypertension: Secondary | ICD-10-CM

## 2018-03-03 DIAGNOSIS — Z0001 Encounter for general adult medical examination with abnormal findings: Secondary | ICD-10-CM | POA: Diagnosis not present

## 2018-03-03 DIAGNOSIS — Z0000 Encounter for general adult medical examination without abnormal findings: Secondary | ICD-10-CM

## 2018-03-03 LAB — CBC WITH DIFFERENTIAL/PLATELET
BASOS: 0 %
Basophils Absolute: 0.1 10*3/uL (ref 0.0–0.2)
EOS (ABSOLUTE): 0.1 10*3/uL (ref 0.0–0.4)
EOS: 1 %
HEMOGLOBIN: 16 g/dL (ref 13.0–17.7)
Hematocrit: 48 % (ref 37.5–51.0)
Immature Grans (Abs): 0 10*3/uL (ref 0.0–0.1)
Immature Granulocytes: 0 %
Lymphocytes Absolute: 3.1 10*3/uL (ref 0.7–3.1)
Lymphs: 26 %
MCH: 27.2 pg (ref 26.6–33.0)
MCHC: 33.3 g/dL (ref 31.5–35.7)
MCV: 82 fL (ref 79–97)
MONOS ABS: 0.9 10*3/uL (ref 0.1–0.9)
Monocytes: 7 %
Neutrophils Absolute: 7.9 10*3/uL — ABNORMAL HIGH (ref 1.4–7.0)
Neutrophils: 66 %
Platelets: 370 10*3/uL (ref 150–450)
RBC: 5.89 x10E6/uL — ABNORMAL HIGH (ref 4.14–5.80)
RDW: 13.6 % (ref 12.3–15.4)
WBC: 12.1 10*3/uL — ABNORMAL HIGH (ref 3.4–10.8)

## 2018-03-03 LAB — CMP14+EGFR
ALBUMIN: 4.8 g/dL (ref 3.5–5.5)
ALT: 53 IU/L — AB (ref 0–44)
AST: 31 IU/L (ref 0–40)
Albumin/Globulin Ratio: 1.8 (ref 1.2–2.2)
Alkaline Phosphatase: 121 IU/L — ABNORMAL HIGH (ref 39–117)
BUN / CREAT RATIO: 15 (ref 9–20)
BUN: 16 mg/dL (ref 6–20)
Bilirubin Total: 0.3 mg/dL (ref 0.0–1.2)
CALCIUM: 9.8 mg/dL (ref 8.7–10.2)
CO2: 25 mmol/L (ref 20–29)
CREATININE: 1.06 mg/dL (ref 0.76–1.27)
Chloride: 99 mmol/L (ref 96–106)
GFR calc non Af Amer: 97 mL/min/{1.73_m2} (ref >59)
GFR, EST AFRICAN AMERICAN: 112 mL/min/{1.73_m2} (ref >59)
Globulin, Total: 2.7 g/dL (ref 1.5–4.5)
Glucose: 93 mg/dL (ref 65–99)
Potassium: 4.7 mmol/L (ref 3.5–5.2)
Sodium: 143 mmol/L (ref 134–144)
TOTAL PROTEIN: 7.5 g/dL (ref 6.0–8.5)

## 2018-03-03 LAB — LIPID PANEL
CHOLESTEROL TOTAL: 192 mg/dL (ref 100–199)
Chol/HDL Ratio: 3.7 ratio (ref 0.0–5.0)
HDL: 52 mg/dL (ref >39)
LDL CALC: 125 mg/dL — AB (ref 0–99)
Triglycerides: 76 mg/dL (ref 0–149)
VLDL Cholesterol Cal: 15 mg/dL (ref 5–40)

## 2018-03-03 NOTE — Progress Notes (Signed)
BP (!) 142/97   Pulse 75   Temp (!) 97 F (36.1 C) (Oral)   Ht 5' 8.25" (1.734 m)   Wt 215 lb 12.8 oz (97.9 kg)   BMI 32.57 kg/m    Subjective:    Patient ID: Jackson Mitchell, male    DOB: [birth year], 27 y.o.   MRN: [id number]  HPI: Jackson Mitchell is a 27 y.o. male presenting on 03/03/2018 for Annual Exam   HPI Adult well exam and physical Patient is coming in for adult well exam and physical today.  He says that overall his health is doing very well and he is starting to get into the habit of working out again after having a newborn child. Patient denies any chest pain, shortness of breath, headaches or vision issues, abdominal complaints, diarrhea, nausea, vomiting, or joint issues.   Relevant past medical, surgical, family and social history reviewed and updated as indicated. Interim medical history since our last visit reviewed. Allergies and medications reviewed and updated.  Review of Systems  Constitutional: Negative for chills and fever.  HENT: Negative for ear pain and tinnitus.   Eyes: Negative for pain.  Respiratory: Negative for cough, shortness of breath and wheezing.   Cardiovascular: Negative for chest pain, palpitations and leg swelling.  Gastrointestinal: Negative for abdominal pain, blood in stool, constipation and diarrhea.  Genitourinary: Negative for dysuria and hematuria.  Musculoskeletal: Negative for back pain and myalgias.  Skin: Negative for rash.  Neurological: Negative for dizziness, weakness and headaches.  Psychiatric/Behavioral: Negative for suicidal ideas.    Per HPI unless specifically indicated above   Allergies as of 03/03/2018      Reactions   Penicillins Rash      Medication List       Accurate as of March 03, 2018 10:45 AM. Always use your most recent med list.        ibuprofen 400 MG tablet Commonly known as:  ADVIL,MOTRIN Take 400 mg by mouth every 6 (six) hours as needed.   loratadine 10 MG tablet Commonly known as:   CLARITIN Take 10 mg by mouth daily.          Objective:    BP (!) 142/97   Pulse 75   Temp (!) 97 F (36.1 C) (Oral)   Ht 5' 8.25" (1.734 m)   Wt 215 lb 12.8 oz (97.9 kg)   BMI 32.57 kg/m   Wt Readings from Last 3 Encounters:  03/03/18 215 lb 12.8 oz (97.9 kg)  05/13/17 207 lb 12.8 oz (94.3 kg)  07/02/16 215 lb (97.5 kg)    Physical Exam Vitals signs reviewed.  Constitutional:      General: He is not in acute distress.    Appearance: He is well-developed. He is not diaphoretic.  HENT:     Right Ear: External ear normal.     Left Ear: External ear normal.     Nose: Nose normal.     Mouth/Throat:     Pharynx: No oropharyngeal exudate.  Eyes:     General: No scleral icterus.       Right eye: No discharge.     Conjunctiva/sclera: Conjunctivae normal.     Pupils: Pupils are equal, round, and reactive to light.  Neck:     Musculoskeletal: Neck supple.     Thyroid: No thyromegaly.  Cardiovascular:     Rate and Rhythm: Normal rate and regular rhythm.     Heart sounds: Normal heart sounds. No murmur.  Pulmonary:  Effort: Pulmonary effort is normal. No respiratory distress.     Breath sounds: Normal breath sounds. No wheezing.  Abdominal:     General: Bowel sounds are normal. There is no distension.     Palpations: Abdomen is soft.     Tenderness: There is no abdominal tenderness. There is no guarding or rebound.  Musculoskeletal: Normal range of motion.  Lymphadenopathy:     Cervical: No cervical adenopathy.  Skin:    General: Skin is warm and dry.     Findings: No rash.  Neurological:     Mental Status: He is alert and oriented to person, place, and time.     Coordination: Coordination normal.  Psychiatric:        Behavior: Behavior normal.       Assessment & Plan:   Problem List Items Addressed This Visit      Other   Obesity (BMI 30.0-34.9)   Relevant Orders   Lipid panel    Other Visit Diagnoses    Well adult exam    -  Primary   Relevant  Orders   CBC with Differential/Platelet   CMP14+EGFR   Lipid panel   Need for immunization against influenza       Relevant Orders   Flu Vaccine QUAD 36+ mos IM (Completed)   Elevated blood pressure reading          Patient will keep track of his blood pressure over the next month and send some blood pressure results into [place] through my chart and if it continues to be elevated then will have him come back and discuss options for treatment Follow up plan: Return in about 1 year (around 03/04/2019), or if symptoms worsen or fail to improve.  Counseling provided for all of the vaccine components Orders Placed This Encounter  Procedures  . Flu Vaccine QUAD 36+ mos IM  . CBC with Differential/Platelet  . CMP14+EGFR  . Lipid panel    Caryl Pina, MD [facility] 03/03/2018, 10:45 AM

## 2018-03-03 NOTE — Addendum Note (Signed)
Addended by: Arville CareETTINGER, Jahvier Aldea on: 03/03/2018 10:57 AM   Modules accepted: Orders

## 2018-03-05 LAB — ABO AND RH: Rh Factor: POSITIVE

## 2018-03-05 LAB — SPECIMEN STATUS REPORT

## 2018-04-21 ENCOUNTER — Encounter: Payer: Self-pay | Admitting: Family Medicine

## 2018-04-28 ENCOUNTER — Encounter: Payer: Self-pay | Admitting: Family Medicine

## 2018-05-07 ENCOUNTER — Ambulatory Visit: Payer: BC Managed Care – PPO | Admitting: Physician Assistant

## 2018-05-07 ENCOUNTER — Encounter: Payer: Self-pay | Admitting: Physician Assistant

## 2018-05-07 VITALS — BP 151/90 | HR 92 | Temp 97.9°F | Ht 68.0 in | Wt 214.0 lb

## 2018-05-07 DIAGNOSIS — J0110 Acute frontal sinusitis, unspecified: Secondary | ICD-10-CM | POA: Diagnosis not present

## 2018-05-07 MED ORDER — FLUTICASONE PROPIONATE 50 MCG/ACT NA SUSP: 2.0000 | Freq: Every day | NASAL | 6 refills | Status: DC

## 2018-05-07 MED ORDER — AZITHROMYCIN 250 MG PO TABS: ORAL_TABLET | ORAL | 0 refills | Status: DC

## 2018-05-07 MED ORDER — PREDNISONE 10 MG (21) PO TBPK: ORAL_TABLET | ORAL | 0 refills | Status: DC

## 2018-05-07 MED ORDER — FLUTICASONE PROPIONATE 50 MCG/ACT NA SUSP: 2.0000 | Freq: Every day | NASAL | 6 refills | Status: AC

## 2018-05-09 NOTE — Progress Notes (Signed)
BP (!) 151/90 (BP Location: Right Arm, Cuff Size: Normal)   Pulse 92   Temp 97.9 F (36.6 C) (Oral)   Ht 5\' 8"  (1.727 m)   Wt 214 lb (97.1 kg)   BMI 32.54 kg/m    Subjective:    Patient ID: Jackson Mitchell, male    DOB: 06-18-1990, 28 y.o.   MRN: [id number]  HPI: Jackson Mitchell is a 28 y.o. male presenting on 05/07/2018 for Sinusitis  This patient has had many days of sinus headache and postnasal drainage. There is copious drainage at times. Denies any fever at this time. There has been a history of sinus infections in the past.  No history of sinus surgery. There is cough at night. It has become more prevalent in recent days.   Past Medical History:  Diagnosis Date  . Allergy    pt has a hx of taking allergy injections for 5 years. Last time he had injection was 2008.   Relevant past medical, surgical, family and social history reviewed and updated as indicated. Interim medical history since our last visit reviewed. Allergies and medications reviewed and updated. DATA REVIEWED: CHART IN EPIC  Family History reviewed for pertinent findings.  Review of Systems  Constitutional: Positive for fatigue. Negative for appetite change and fever.  HENT: Positive for congestion, sinus pressure, sinus pain and sore throat.   Eyes: Negative.  Negative for pain and visual disturbance.  Respiratory: Negative for cough, chest tightness, shortness of breath and wheezing.   Cardiovascular: Negative.  Negative for chest pain, palpitations and leg swelling.  Gastrointestinal: Negative.  Negative for abdominal pain, diarrhea, nausea and vomiting.  Endocrine: Negative.   Genitourinary: Negative.   Musculoskeletal: Negative for back pain and myalgias.  Skin: Negative.  Negative for color change and rash.  Neurological: Positive for headaches. Negative for weakness and numbness.  Psychiatric/Behavioral: Negative.     Allergies as of 05/07/2018      Reactions   Penicillins Rash      Medication  List       Accurate as of May 07, 2018 11:59 PM. Always use your most recent med list.        azithromycin 250 MG tablet Commonly known as:  ZITHROMAX Z-PAK Take as directed   fluticasone 50 MCG/ACT nasal spray Commonly known as:  FLONASE Place 2 sprays into both nostrils daily.   ibuprofen 400 MG tablet Commonly known as:  ADVIL,MOTRIN Take 400 mg by mouth every 6 (six) hours as needed.   loratadine 10 MG tablet Commonly known as:  CLARITIN Take 10 mg by mouth daily.   predniSONE 10 MG (21) Tbpk tablet Commonly known as:  STERAPRED UNI-PAK 21 TAB As directed x 6 days          Objective:    BP (!) 151/90 (BP Location: Right Arm, Cuff Size: Normal)   Pulse 92   Temp 97.9 F (36.6 C) (Oral)   Ht 5\' 8"  (1.727 m)   Wt 214 lb (97.1 kg)   BMI 32.54 kg/m   Allergies  Allergen Reactions  . Penicillins Rash    Wt Readings from Last 3 Encounters:  05/07/18 214 lb (97.1 kg)  03/03/18 215 lb 12.8 oz (97.9 kg)  05/13/17 207 lb 12.8 oz (94.3 kg)    Physical Exam Constitutional:      Appearance: He is well-developed.  HENT:     Head: Normocephalic and atraumatic.     Right Ear: Tympanic membrane and external ear normal. No  middle ear effusion.     Left Ear: Tympanic membrane and external ear normal.  No middle ear effusion.     Nose: Mucosal edema and rhinorrhea present.     Right Sinus: No maxillary sinus tenderness.     Left Sinus: No maxillary sinus tenderness.     Mouth/Throat:     Pharynx: Uvula midline. Posterior oropharyngeal erythema present.  Eyes:     General:        Right eye: No discharge.        Left eye: No discharge.     Conjunctiva/sclera: Conjunctivae normal.     Pupils: Pupils are equal, round, and reactive to light.  Neck:     Musculoskeletal: Normal range of motion.  Cardiovascular:     Rate and Rhythm: Normal rate and regular rhythm.     Heart sounds: Normal heart sounds.  Pulmonary:     Effort: Pulmonary effort is normal. No  respiratory distress.     Breath sounds: Normal breath sounds. No wheezing.  Abdominal:     Palpations: Abdomen is soft.  Lymphadenopathy:     Cervical: No cervical adenopathy.  Skin:    General: Skin is warm and dry.  Neurological:     Mental Status: He is alert and oriented to person, place, and time.         Assessment & Plan:   1. Acute non-recurrent frontal sinusitis - azithromycin (ZITHROMAX Z-PAK) 250 MG tablet; Take as directed  Dispense: 6 each; Refill: 0 - fluticasone (FLONASE) 50 MCG/ACT nasal spray; Place 2 sprays into both nostrils daily.  Dispense: 16 g; Refill: 6 - predniSONE (STERAPRED UNI-PAK 21 TAB) 10 MG (21) TBPK tablet; As directed x 6 days  Dispense: 21 tablet; Refill: 0   Continue all other maintenance medications as listed above.  Follow up plan: No follow-ups on file.  Educational handout given for survey  Remus Loffler PA-C [facility] [address]  [place], [place] [address] [phone]   05/09/2018, 8:37 PM

## 2018-05-10 IMAGING — DX DG LUMBAR SPINE 2-3V
2 series · 2 of 2 positions shown · non-contrast
Comparison: None.

CLINICAL DATA: Acute left low back/lumbar spine pain radiating down
left leg. Initial encounter.

EXAM:
LUMBAR SPINE - 2-3 VIEW

[l-spine ap]
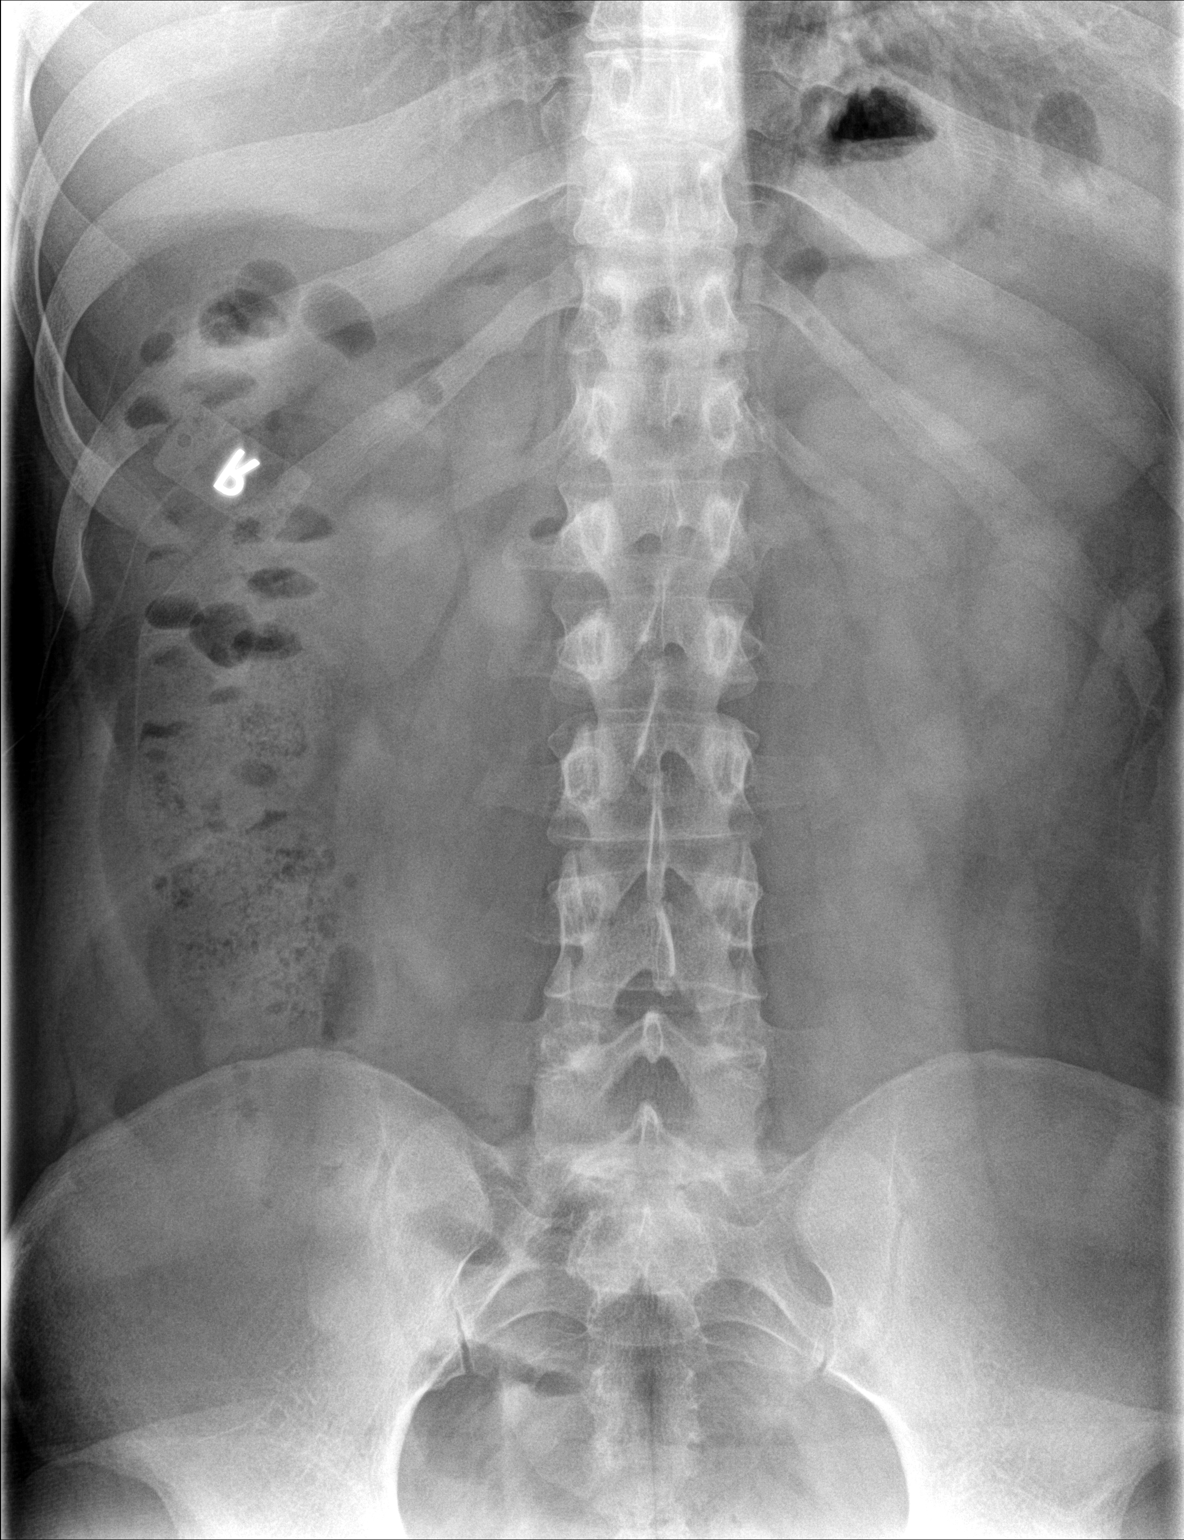

[l-spine lat]
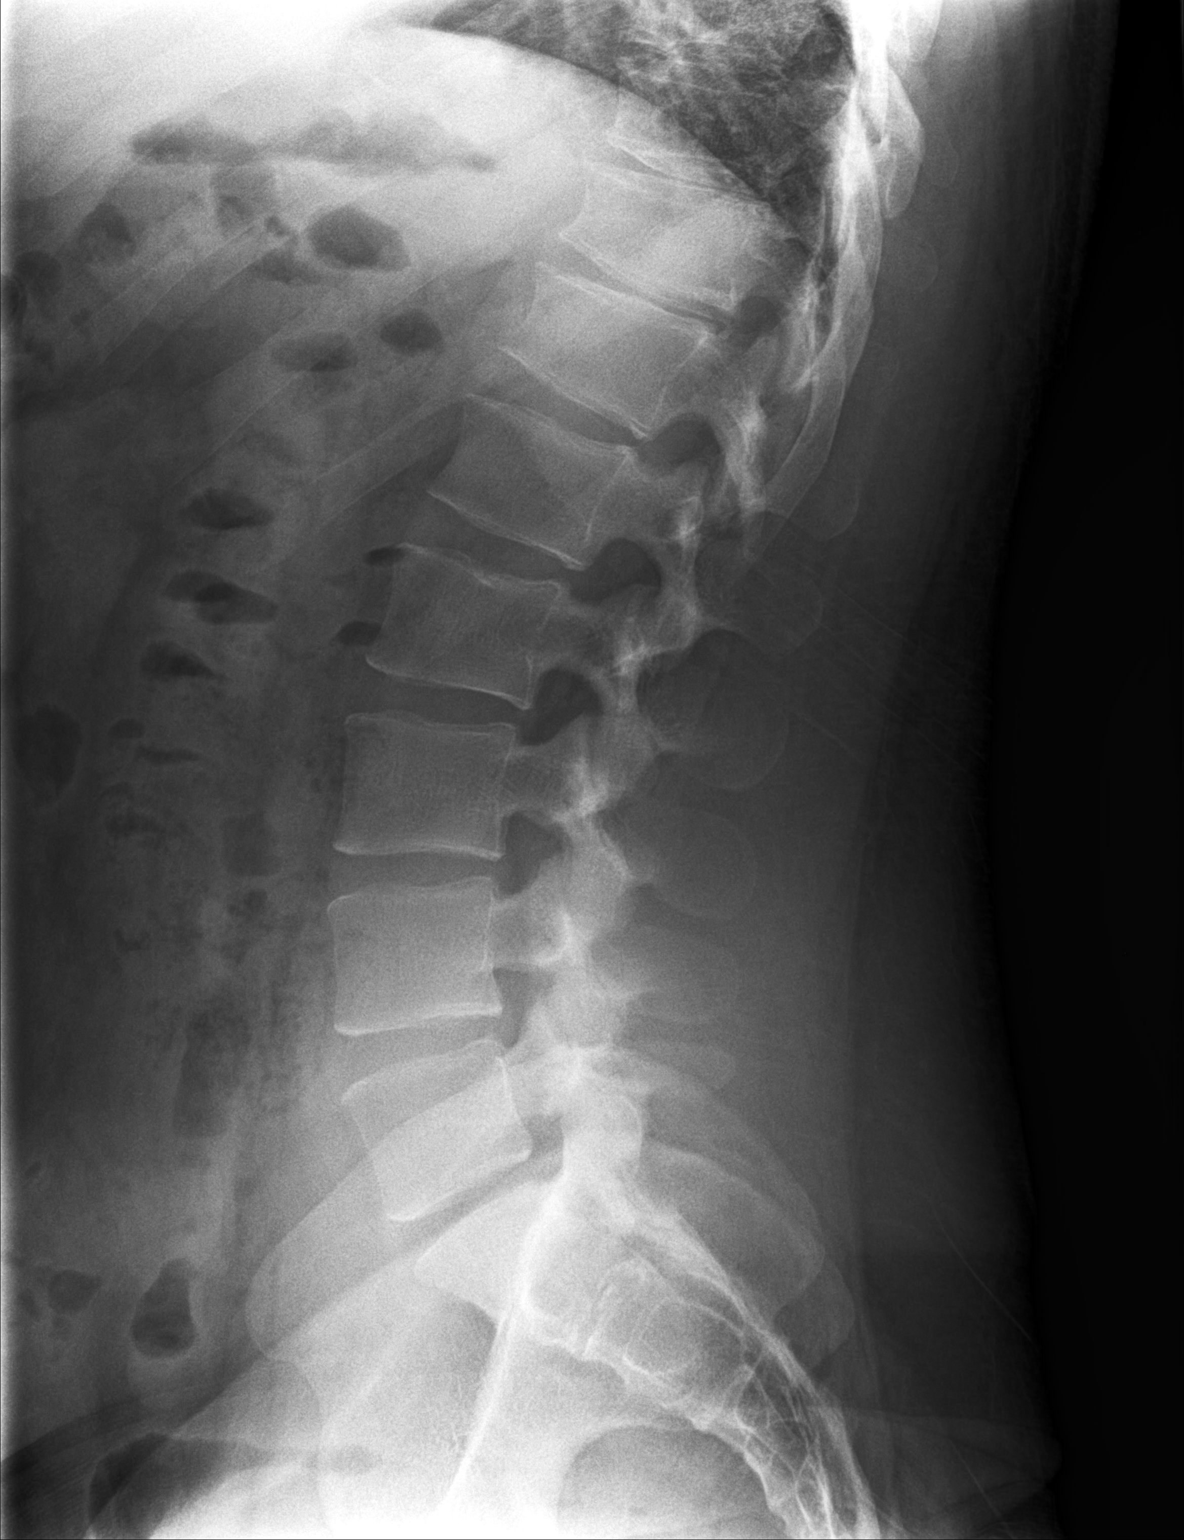

[2 of 2 positions shown; findings below may reference images not displayed]

FINDINGS: Alignment is normal.

No fracture subluxation identified.

Mild degenerative disc disease in the lower thoracic spine noted.

No focal bony lesions are present.
IMPRESSION: No acute abnormality.

Mild degenerative disc disease in the lower thoracic spine.

## 2018-08-01 ENCOUNTER — Encounter: Payer: Self-pay | Admitting: Family Medicine

## 2018-08-02 ENCOUNTER — Encounter: Payer: Self-pay | Admitting: Family Medicine

## 2018-08-02 ENCOUNTER — Other Ambulatory Visit: Payer: Self-pay

## 2018-08-02 ENCOUNTER — Ambulatory Visit (INDEPENDENT_AMBULATORY_CARE_PROVIDER_SITE_OTHER): Payer: BC Managed Care – PPO | Admitting: Family Medicine

## 2018-08-02 DIAGNOSIS — L237 Allergic contact dermatitis due to plants, except food: Secondary | ICD-10-CM

## 2018-08-02 MED ORDER — PREDNISONE 10 MG (21) PO TBPK: ORAL_TABLET | ORAL | 0 refills | Status: DC

## 2018-08-02 MED ORDER — TRIAMCINOLONE ACETONIDE 0.1 % EX CREA: 1.0000 "application " | TOPICAL_CREAM | Freq: Two times a day (BID) | CUTANEOUS | 0 refills | Status: DC

## 2018-08-02 NOTE — Progress Notes (Signed)
   Virtual Visit via telephone Note  I connected with Jackson Mitchell on 08/02/18 at 1514 by telephone and verified that I am speaking with the correct person using two identifiers. Jackson Mitchell is currently located at home and no other people are currently with her during visit. The provider, Elige Radon Dettinger, MD is located in their office at time of visit.  Call ended at 1520  I discussed the limitations, risks, security and privacy concerns of performing an evaluation and management service by telephone and the availability of in person appointments. I also discussed with the patient that there may be a patient responsible charge related to this service. The patient expressed understanding and agreed to proceed.   History and Present Illness: Rash  Patient has a rash that started on hand and shin.  Very itchy and red and raised but no warmth or purulent drainage.  He started noticing this 3-4 days ago and it is spreading. No fevers or chills.  He has had it before but a long time ago and his brother.he also from working on fields recently.  He was working in his new property and feels like it was growing all over and then got exposed and there.  No diagnosis found.  Outpatient Encounter Medications as of 08/02/2018  Medication Sig  . azithromycin (ZITHROMAX Z-PAK) 250 MG tablet Take as directed  . fluticasone (FLONASE) 50 MCG/ACT nasal spray Place 2 sprays into both nostrils daily.  Marland Kitchen ibuprofen (ADVIL,MOTRIN) 400 MG tablet Take 400 mg by mouth every 6 (six) hours as needed.  . loratadine (CLARITIN) 10 MG tablet Take 10 mg by mouth daily.  . predniSONE (STERAPRED UNI-PAK 21 TAB) 10 MG (21) TBPK tablet As directed x 6 days   No facility-administered encounter medications on file as of 08/02/2018.     Review of Systems  Constitutional: Negative for chills and fever.  Respiratory: Negative for shortness of breath and wheezing.   Cardiovascular: Negative for chest pain and leg swelling.   Skin: Positive for rash.  All other systems reviewed and are negative.   Observations/Objective: Patient sounds comfortable and in no acute distress  Assessment and Plan: Problem List Items Addressed This Visit    None    Visit Diagnoses    Poison ivy dermatitis    -  Primary   Relevant Medications   predniSONE (STERAPRED UNI-PAK 21 TAB) 10 MG (21) TBPK tablet   triamcinolone cream (KENALOG) 0.1 %       Follow Up Instructions: As needed    I discussed the assessment and treatment plan with the patient. The patient was provided an opportunity to ask questions and all were answered. The patient agreed with the plan and demonstrated an understanding of the instructions.   The patient was advised to call back or seek an in-person evaluation if the symptoms worsen or if the condition fails to improve as anticipated.  The above assessment and management plan was discussed with the patient. The patient verbalized understanding of and has agreed to the management plan. Patient is aware to call the clinic if symptoms persist or worsen. Patient is aware when to return to the clinic for a follow-up visit. Patient educated on when it is appropriate to go to the emergency department.    I provided 6 minutes of non-face-to-face time during this encounter.    Nils Pyle, MD

## 2018-10-22 ENCOUNTER — Ambulatory Visit (INDEPENDENT_AMBULATORY_CARE_PROVIDER_SITE_OTHER): Payer: BC Managed Care – PPO | Admitting: Family

## 2018-10-22 ENCOUNTER — Encounter: Payer: Self-pay | Admitting: Family

## 2018-10-22 DIAGNOSIS — J069 Acute upper respiratory infection, unspecified: Secondary | ICD-10-CM | POA: Diagnosis not present

## 2018-10-22 NOTE — Progress Notes (Signed)
   Virtual Visit via telephone Note Due to COVID-19 pandemic this visit was conducted virtually. This visit type was conducted due to national recommendations for restrictions regarding the COVID-19 Pandemic (e.g. social distancing, sheltering in place) in an effort to limit this patient's exposure and mitigate transmission in our community. All issues noted in this document were discussed and addressed.  A physical exam was not performed with this format.  I connected with Jackson Mitchell on 10/22/18 at 3:18 pm by telephone and verified that I am speaking with the correct person using two identifiers. Jackson Mitchell is currently located at home and no one is currently with her during visit. The provider, Evelina Dun, FNP is located in their office at time of visit.  I discussed the limitations, risks, security and privacy concerns of performing an evaluation and management service by telephone and the availability of in person appointments. I also discussed with the patient that there may be a patient responsible charge related to this service. The patient expressed understanding and agreed to proceed.   History and Present Illness:  Sinusitis This is a new problem. The current episode started yesterday. The problem is unchanged. Maximum temperature: 99. The pain is mild. Associated symptoms include congestion, headaches, sinus pressure and a sore throat. Pertinent negatives include no chills, coughing, ear pain, hoarse voice, shortness of breath or sneezing. Past treatments include oral decongestants. The treatment provided mild relief.      Review of Systems  Constitutional: Negative for chills.  HENT: Positive for congestion, sinus pressure and sore throat. Negative for ear pain, hoarse voice and sneezing.   Respiratory: Negative for cough and shortness of breath.   Neurological: Positive for headaches.  All other systems reviewed and are negative.    Observations/Objective: No SOB or  distress noted   Assessment and Plan: 1. Viral URI Given symptoms of patient, wife, and daughter I have recommended COVID testing Continue Flonase - Use a cool mist humidifier  -Use saline nose sprays frequently -Force fluids -For any cough or congestion  Use plain Mucinex- regular strength or max strength is fine -For fever or aces or pains- take tylenol or ibuprofen. -Throat lozenges if help Call office if symptoms worsen or do not improve     I discussed the assessment and treatment plan with the patient. The patient was provided an opportunity to ask questions and all were answered. The patient agreed with the plan and demonstrated an understanding of the instructions.   The patient was advised to call back or seek an in-person evaluation if the symptoms worsen or if the condition fails to improve as anticipated.  The above assessment and management plan was discussed with the patient. The patient verbalized understanding of and has agreed to the management plan. Patient is aware to call the clinic if symptoms persist or worsen. Patient is aware when to return to the clinic for a follow-up visit. Patient educated on when it is appropriate to go to the emergency department.   Time call ended:  3:28 pm  I provided 10 minutes of non-face-to-face time during this encounter.    Evelina Dun, FNP

## 2019-04-15 ENCOUNTER — Other Ambulatory Visit: Payer: Self-pay

## 2019-04-15 ENCOUNTER — Ambulatory Visit: Payer: BC Managed Care – PPO | Attending: Internal Medicine

## 2019-04-15 DIAGNOSIS — Z20822 Contact with and (suspected) exposure to covid-19: Secondary | ICD-10-CM

## 2019-04-16 LAB — NOVEL CORONAVIRUS, NAA: SARS-CoV-2, NAA: NOT DETECTED

## 2022-05-14 ENCOUNTER — Ambulatory Visit: Payer: Self-pay | Admitting: Orthopedic Surgery

## 2022-05-14 DIAGNOSIS — M5126 Other intervertebral disc displacement, lumbar region: Secondary | ICD-10-CM

## 2022-05-14 NOTE — H&P (Signed)
Jackson Mitchell is an 32 y.o. male.   Chief Complaint: back and left leg pain, numbness, tingling, weakness HPI: Reason for Visit: (normal) visit for: (back); 7 weeks Severity: pain level 2/10 Associated Symptoms: numbness/tingling Are you working? not at all Medications: not helping at all (dose pack) Recheck after dose pack. States pain level is 2/10 today. Dose pack did not help with pain.  Past Medical History:  Diagnosis Date   Allergy    pt has a hx of taking allergy injections for 5 years. Last time he had injection was 2008.   No past surgical hx  Family History  Problem Relation Age of Onset   Depression Mother    Hyperlipidemia Mother    Arthritis Father    COPD Father    Hyperlipidemia Father    Hypertension Father    Alcohol abuse Maternal Grandmother    Arthritis Maternal Grandmother    Arthritis Maternal Grandfather    Cancer Maternal Grandfather        hx of skin cancer   Social History:  reports that he quit smoking about 10 years ago. He quit smokeless tobacco use about 10 years ago. He reports current alcohol use of about 3.0 standard drinks of alcohol per week. He reports that he does not use drugs.  Allergies:  Allergies  Allergen Reactions   Penicillins Rash   Current meds: Clomid 50 mg tablet gabapentin 300 mg capsule loratadine 10 mg tablet meloxicam 15 mg tablet methocarbamoL 750 mg tablet  Review of Systems  Constitutional: Negative.   HENT: Negative.    Eyes: Negative.   Respiratory: Negative.    Cardiovascular: Negative.   Gastrointestinal: Negative.   Endocrine: Negative.   Genitourinary: Negative.   Musculoskeletal:  Positive for back pain and gait problem.  Skin: Negative.   Neurological:  Positive for weakness and numbness.  Psychiatric/Behavioral: Negative.      There were no vitals taken for this visit. Physical Exam Constitutional:      General: He is in acute distress.     Appearance: Normal appearance.  HENT:      Head: Normocephalic.     Right Ear: External ear normal.     Left Ear: External ear normal.     Nose: Nose normal.     Mouth/Throat:     Pharynx: Oropharynx is clear.  Eyes:     Conjunctiva/sclera: Conjunctivae normal.  Cardiovascular:     Rate and Rhythm: Normal rate and regular rhythm.     Pulses: Normal pulses.  Pulmonary:     Effort: Pulmonary effort is normal.  Abdominal:     General: Bowel sounds are normal.  Musculoskeletal:     Cervical back: Normal range of motion.     Comments: Patient still is unable to heel walk. Straight leg raise is positive for L5-S1 radicular pain he has diminished repetitive plantarflexion on the left decree sensation L5-S1 dermatome  Skin:    General: Skin is warm and dry.  Neurological:     Mental Status: He is alert.    Three-view x-rays lumbar spine demonstrate disc base narrowing L5-S1. No instability in flexion extension no fracture.  MRI demonstrates an extruded fragment at L4-5 migrating caudad compressing the L5 nerve root on the left. There is a disc herniation small L5-S1 on the left effacing the S1 nerve root.  Assessment/Plan Impression:  1. 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 This will require at 2 levels. I will have the radiologist rereview the MRI images to include the L4-5 extruded fragment. I had an extensive discussion with the patient concerning the pathology relevant anatomy and treatment options. At this point exhausting conservative treatment and in the presence of a neurologic deficit we discussed microlumbar decompression. I discussed the risks and benefits including bleeding, infection, DVT, PE, anesthetic complications, worsening in  their symptoms, improvement in their symptoms, C SF leakage, epidural fibrosis, need for future surgeries such as revision discectomy and lumbar fusion. I also indicated that this is an operation to basically decompress the nerve roots to allow recovery as opposed to fixing a herniated disc if it is encountered and that the incidence of recurrent chest disc herniation can approach 15%. Also that nerve root recovery is variable and may not recover completely. Any ligament or bone that is contributing to compressing the nerves will be removed as well.  I discussed the operative course including overnight in the hospital. Immediate ambulation. Follow-up in 2 weeks for suture removal. 6 weeks until healing of the herniation and surgical incision followed by 6 weeks of reconditioning and strengthening of the core musculature. Also discussed the need to employ the concepts of disc pressure management and core motion following the surgery to minimize the risk of recurrent disc herniation. We will obtain preoperative clearance i if necessary and proceed accordingly. No history of DVT or MRSA. He is allergic to penicillin hives as a child. Will use vancomycin and gentamicin.  This to be followed by meloxicam to continue Patient was given a prescription for an anti-inflammatory or instructed to take over-the-counter anti-inflammatory medications. Side effects were discussed including potential elevation of blood pressure, long-term effect on the kidneys and liver, ulcer risks and therefore the need for appropriate monitoring if taken continually. That would include regular blood chemistries by a primary care physician to evaluate kidney function as well as liver function and monitor for potential gastrointestinal effects. My preference is to utilize anti-inflammatories periodically and preemptively to reduce inflammation on a short-term basis. This would decrease the risks associated with long-term use of those  medications. In addition anti-inflammatory medications should be taken with a meal. They should not be taken if a blood thinner is being used or the patient has a history of peptic ulcer disease.  Gabapentin to be taken at night for neuropathic pain    Patient reports that this occurred well performing work conditioning for a knee injury on the right. And he has informed his orthopedic surgeon and physical therapist.  He and his wife 3 weeks into the birth of the daughter's name Sadie  Patient is to call or present to the emergency room if there is any numbness or weakness to suggest worsening of their condition. In addition although rare if there is loss of bowel or bladder function such as incontinence or inability to void that this may represent a cauda equina syndrome. If noted the patient is to present immediately to the emergency room for evaluation and treatment otherwise permanent bowel or bladder dysfunction can occur as a result.  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  This injury did occur during a work conditioning for his knee he was doing a lot of squatting with  40 to 50 pound boxes. Where he developed pain in his back and leg  Plan microdiscectomy L4-5, L5-S1 left  Cecilie Kicks, PA-C for Dr Tonita Cong 05/14/2022, 3:29 PM

## 2022-05-14 NOTE — H&P (View-Only) (Signed)
Jackson Mitchell is an 31 y.o. male.   Chief Complaint: back and left leg pain, numbness, tingling, weakness HPI: Reason for Visit: (normal) visit for: (back); 7 weeks Severity: pain level 2/10 Associated Symptoms: numbness/tingling Are you working? not at all Medications: not helping at all (dose pack) Recheck after dose pack. States pain level is 2/10 today. Dose pack did not help with pain.  Past Medical History:  Diagnosis Date   Allergy    pt has a hx of taking allergy injections for 5 years. Last time he had injection was 2008.   No past surgical hx  Family History  Problem Relation Age of Onset   Depression Mother    Hyperlipidemia Mother    Arthritis Father    COPD Father    Hyperlipidemia Father    Hypertension Father    Alcohol abuse Maternal Grandmother    Arthritis Maternal Grandmother    Arthritis Maternal Grandfather    Cancer Maternal Grandfather        hx of skin cancer   Social History:  reports that he quit smoking about 10 years ago. He quit smokeless tobacco use about 10 years ago. He reports current alcohol use of about 3.0 standard drinks of alcohol per week. He reports that he does not use drugs.  Allergies:  Allergies  Allergen Reactions   Penicillins Rash   Current meds: Clomid 50 mg tablet gabapentin 300 mg capsule loratadine 10 mg tablet meloxicam 15 mg tablet methocarbamoL 750 mg tablet  Review of Systems  Constitutional: Negative.   HENT: Negative.    Eyes: Negative.   Respiratory: Negative.    Cardiovascular: Negative.   Gastrointestinal: Negative.   Endocrine: Negative.   Genitourinary: Negative.   Musculoskeletal:  Positive for back pain and gait problem.  Skin: Negative.   Neurological:  Positive for weakness and numbness.  Psychiatric/Behavioral: Negative.      There were no vitals taken for this visit. Physical Exam Constitutional:      General: He is in acute distress.     Appearance: Normal appearance.  HENT:      Head: Normocephalic.     Right Ear: External ear normal.     Left Ear: External ear normal.     Nose: Nose normal.     Mouth/Throat:     Pharynx: Oropharynx is clear.  Eyes:     Conjunctiva/sclera: Conjunctivae normal.  Cardiovascular:     Rate and Rhythm: Normal rate and regular rhythm.     Pulses: Normal pulses.  Pulmonary:     Effort: Pulmonary effort is normal.  Abdominal:     General: Bowel sounds are normal.  Musculoskeletal:     Cervical back: Normal range of motion.     Comments: Patient still is unable to heel walk. Straight leg raise is positive for L5-S1 radicular pain he has diminished repetitive plantarflexion on the left decree sensation L5-S1 dermatome  Skin:    General: Skin is warm and dry.  Neurological:     Mental Status: He is alert.    Three-view x-rays lumbar spine demonstrate disc base narrowing L5-S1. No instability in flexion extension no fracture.  MRI demonstrates an extruded fragment at L4-5 migrating caudad compressing the L5 nerve root on the left. There is a disc herniation small L5-S1 on the left effacing the S1 nerve root.  Assessment/Plan Impression:  1. 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 This will require at 2 levels. I will have the radiologist rereview the MRI images to include the L4-5 extruded fragment. I had an extensive discussion with the patient concerning the pathology relevant anatomy and treatment options. At this point exhausting conservative treatment and in the presence of a neurologic deficit we discussed microlumbar decompression. I discussed the risks and benefits including bleeding, infection, DVT, PE, anesthetic complications, worsening in  their symptoms, improvement in their symptoms, C SF leakage, epidural fibrosis, need for future surgeries such as revision discectomy and lumbar fusion. I also indicated that this is an operation to basically decompress the nerve roots to allow recovery as opposed to fixing a herniated disc if it is encountered and that the incidence of recurrent chest disc herniation can approach 15%. Also that nerve root recovery is variable and may not recover completely. Any ligament or bone that is contributing to compressing the nerves will be removed as well.  I discussed the operative course including overnight in the hospital. Immediate ambulation. Follow-up in 2 weeks for suture removal. 6 weeks until healing of the herniation and surgical incision followed by 6 weeks of reconditioning and strengthening of the core musculature. Also discussed the need to employ the concepts of disc pressure management and core motion following the surgery to minimize the risk of recurrent disc herniation. We will obtain preoperative clearance i if necessary and proceed accordingly. No history of DVT or MRSA. He is allergic to penicillin hives as a child. Will use vancomycin and gentamicin.  This to be followed by meloxicam to continue Patient was given a prescription for an anti-inflammatory or instructed to take over-the-counter anti-inflammatory medications. Side effects were discussed including potential elevation of blood pressure, long-term effect on the kidneys and liver, ulcer risks and therefore the need for appropriate monitoring if taken continually. That would include regular blood chemistries by a primary care physician to evaluate kidney function as well as liver function and monitor for potential gastrointestinal effects. My preference is to utilize anti-inflammatories periodically and preemptively to reduce inflammation on a short-term basis. This would decrease the risks associated with long-term use of those  medications. In addition anti-inflammatory medications should be taken with a meal. They should not be taken if a blood thinner is being used or the patient has a history of peptic ulcer disease.  Gabapentin to be taken at night for neuropathic pain    Patient reports that this occurred well performing work conditioning for a knee injury on the right. And he has informed his orthopedic surgeon and physical therapist.  He and his wife 3 weeks into the birth of the daughter's name Sadie  Patient is to call or present to the emergency room if there is any numbness or weakness to suggest worsening of their condition. In addition although rare if there is loss of bowel or bladder function such as incontinence or inability to void that this may represent a cauda equina syndrome. If noted the patient is to present immediately to the emergency room for evaluation and treatment otherwise permanent bowel or bladder dysfunction can occur as a result.  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  This injury did occur during a work conditioning for his knee he was doing a lot of squatting with   40 to 50 pound boxes. Where he developed pain in his back and leg  Plan microdiscectomy L4-5, L5-S1 left  Mathieu Schloemer M Cristin Penaflor, PA-C for Dr Beane 05/14/2022, 3:29 PM    

## 2022-05-20 NOTE — Pre-Procedure Instructions (Signed)
Surgical Instructions    Your procedure is scheduled on May 29, 2022.  Report to [facility] Main Entrance "A" at 8:40 A.M., then check in with the Admitting office.  Call this number if you have problems the morning of surgery:  [phone]  If you have any questions prior to your surgery date call [phone]: Open Monday-Friday 8am-4pm If you experience any cold or flu symptoms such as cough, fever, chills, shortness of breath, etc. between now and your scheduled surgery, please notify us at the above number.     Remember:  Do not eat after midnight the night before your surgery  You may drink clear liquids until 7:40 AM the morning of your surgery.   Clear liquids allowed are: Water, Non-Citrus Juices (without pulp), Carbonated Beverages, Clear Tea, Black Coffee Only (NO MILK, CREAM OR POWDERED CREAMER of any kind), and Gatorade.  Patient Instructions  The night before surgery:  No food after midnight. ONLY clear liquids after midnight  The day of surgery (if you do NOT have diabetes):  Drink ONE (1) Pre-Surgery Clear Ensure by 7:40 AM the morning of surgery. Drink in one sitting. Do not sip.  This drink was given to you during your hospital. pre-op appointment visit.  Nothing else to drink after completing the  Pre-Surgery Clear Ensure.         If you have questions, please contact your surgeon's office.     Take these medicines the morning of surgery with A SIP OF WATER:  fluticasone (FLONASE) nasal spray   loratadine (CLARITIN)   predniSONE     As of today, STOP taking any Aspirin (unless otherwise instructed by your surgeon) Aleve, Naproxen, Ibuprofen, Motrin, Advil, Goody's, BC's, all herbal medications, fish oil, and all vitamins.                     Do NOT Smoke (Tobacco/Vaping) for 24 hours prior to your procedure.  If you use a CPAP at night, you may bring your mask/headgear for your overnight stay.   Contacts, glasses, piercing's, hearing aid's,  dentures or partials may not be worn into surgery, please bring cases for these belongings.    For patients admitted to the hospital, discharge time will be determined by your treatment team.   Patients discharged the day of surgery will not be allowed to drive home, and someone needs to stay with them for 24 hours.  SURGICAL WAITING ROOM VISITATION Patients having surgery or a procedure may have no more than 2 support people in the waiting area - these visitors may rotate.   Children under the age of 66 must have an adult with them who is not the patient. If the patient needs to stay at the hospital during part of their recovery, the visitor guidelines for inpatient rooms apply. Pre-op nurse will coordinate an appropriate time for 1 support person to accompany patient in pre-op.  This support person may not rotate.   Please refer to the [facility] website for the visitor guidelines for Inpatients (after your surgery is over and you are in a regular room).    Special instructions:   [place]- Preparing For Surgery  Before surgery, you can play an important role. Because skin is not sterile, your skin needs to be as free of germs as possible. You can reduce the number of germs on your skin by washing with CHG (chlorahexidine gluconate) Soap before surgery.  CHG is an antiseptic cleaner which kills germs and bonds  with the skin to continue killing germs even after washing.    Oral Hygiene is also important to reduce your risk of infection.  Remember - BRUSH YOUR TEETH THE MORNING OF SURGERY WITH YOUR REGULAR TOOTHPASTE  Please do not use if you have an allergy to CHG or antibacterial soaps. If your skin becomes reddened/irritated stop using the CHG.  Do not shave (including legs and underarms) for at least 48 hours prior to first CHG shower. It is OK to shave your face.  Please follow these instructions carefully.   Shower the NIGHT BEFORE SURGERY and the MORNING OF SURGERY  If you  chose to wash your hair, wash your hair first as usual with your normal shampoo.  After you shampoo, rinse your hair and body thoroughly to remove the shampoo.  Use CHG Soap as you would any other liquid soap. You can apply CHG directly to the skin and wash gently with a scrungie or a clean washcloth.   Apply the CHG Soap to your body ONLY FROM THE NECK DOWN.  Do not use on open wounds or open sores. Avoid contact with your eyes, ears, mouth and genitals (private parts). Wash Face and genitals (private parts)  with your normal soap.   Wash thoroughly, paying special attention to the area where your surgery will be performed.  Thoroughly rinse your body with warm water from the neck down.  DO NOT shower/wash with your normal soap after using and rinsing off the CHG Soap.  Pat yourself dry with a CLEAN TOWEL.  Wear CLEAN PAJAMAS to bed the night before surgery  Place CLEAN SHEETS on your bed the night before your surgery  DO NOT SLEEP WITH PETS.   Day of Surgery: Take a shower with CHG soap. Do not wear jewelry or makeup Do not wear lotions, powders, perfumes/colognes, or deodorant. Do not shave 48 hours prior to surgery.  Men may shave face and neck. Do not bring valuables to the hospital.  [facility] is not responsible for any belongings or valuables. Do not wear nail polish, gel polish, artificial nails, or any other type of covering on natural nails (fingers and toes) If you have artificial nails or gel coating that need to be removed by a nail salon, please have this removed prior to surgery. Artificial nails or gel coating may interfere with anesthesia's ability to adequately monitor your vital signs.  Wear Clean/Comfortable clothing the morning of surgery Remember to brush your teeth WITH YOUR REGULAR TOOTHPASTE.   Please read over the following fact sheets that you were given.    If you received a COVID test during your pre-op visit  it is requested that you wear a mask  when out in public, stay away from anyone that may not be feeling well and notify your surgeon if you develop symptoms. If you have been in contact with anyone that has tested positive in the last 10 days please notify you surgeon.

## 2022-05-21 ENCOUNTER — Encounter (HOSPITAL_COMMUNITY): Payer: Self-pay

## 2022-05-21 ENCOUNTER — Ambulatory Visit (HOSPITAL_COMMUNITY)
Admission: RE | Admit: 2022-05-21 | Discharge: 2022-05-21 | Disposition: A | Discharge status: Home / Self Care | Payer: BC Managed Care – PPO | Source: Ambulatory Visit | Attending: Orthopedic Surgery | Admitting: Orthopedic Surgery

## 2022-05-21 ENCOUNTER — Encounter (HOSPITAL_COMMUNITY)
Admission: RE | Admit: 2022-05-21 | Discharge: 2022-05-21 | Disposition: A | Discharge status: Home / Self Care | Payer: BC Managed Care – PPO | Source: Ambulatory Visit | Attending: Specialist | Admitting: Specialist

## 2022-05-21 ENCOUNTER — Other Ambulatory Visit: Payer: Self-pay

## 2022-05-21 VITALS — BP 143/93 | HR 72 | Temp 97.9°F | Resp 18 | Ht 67.0 in | Wt 237.0 lb

## 2022-05-21 DIAGNOSIS — M5126 Other intervertebral disc displacement, lumbar region: Secondary | ICD-10-CM | POA: Diagnosis not present

## 2022-05-21 DIAGNOSIS — Z01818 Encounter for other preprocedural examination: Secondary | ICD-10-CM | POA: Diagnosis present

## 2022-05-21 HISTORY — DX: Anxiety disorder, unspecified: F41.9

## 2022-05-21 LAB — CBC
HCT: 47.5 % (ref 39.0–52.0)
Hemoglobin: 15.9 g/dL (ref 13.0–17.0)
MCH: 27.8 pg (ref 26.0–34.0)
MCHC: 33.5 g/dL (ref 30.0–36.0)
MCV: 83 fL (ref 80.0–100.0)
Platelets: 312 10*3/uL (ref 150–400)
RBC: 5.72 MIL/uL (ref 4.22–5.81)
RDW: 14.4 % (ref 11.5–15.5)
WBC: 11.9 10*3/uL — ABNORMAL HIGH (ref 4.0–10.5)
nRBC: 0 % (ref 0.0–0.2)

## 2022-05-21 LAB — SURGICAL PCR SCREEN
MRSA, PCR: NEGATIVE
Staphylococcus aureus: NEGATIVE

## 2022-05-21 NOTE — Progress Notes (Signed)
PCP - Irven Baltimore, MD (Newly established, but still in the same practice as previous PCP Teressa Senter, NP) Cardiologist - Denies  PPM/ICD - Denies Device Orders - n/a Rep Notified - n/a  Chest x-ray - n/a EKG - Denies Stress Test - Denies ECHO - Denies Cardiac Cath - Denies  Sleep Study - Denies CPAP - n/a  No DM  Last dose of GLP1 agonist- n/a GLP1 instructions: n/a  Blood Thinner Instructions: n/a Aspirin Instructions: n/a  ERAS Protcol - Clear liquids until 0740 morning of surgery PRE-SURGERY Ensure or G2- Ensure given to pt with instructions  COVID TEST- n/a   Anesthesia review: No.   Patient denies shortness of breath, fever, cough and chest pain at PAT appointment. Pt does endorse sinus cold January 18th. He started Doxycycline and cough medicine on that day and completed his course with resolution of symptoms in one week. He has been symptoms free since then. Discussed with Karoline Caldwell, PA-C   All instructions explained to the patient, with a verbal understanding of the material. Patient agrees to go over the instructions while at home for a better understanding. Patient also instructed to self quarantine after being tested for COVID-19. The opportunity to ask questions was provided.

## 2022-05-29 ENCOUNTER — Other Ambulatory Visit: Payer: Self-pay

## 2022-05-29 ENCOUNTER — Ambulatory Visit (HOSPITAL_COMMUNITY): Payer: BC Managed Care – PPO | Admitting: Certified Registered Nurse Anesthetist

## 2022-05-29 ENCOUNTER — Encounter (HOSPITAL_COMMUNITY)
Admission: RE | Disposition: A | Discharge status: Home / Self Care | Payer: Self-pay | Source: Home / Self Care | Attending: Specialist

## 2022-05-29 ENCOUNTER — Encounter (HOSPITAL_COMMUNITY): Payer: Self-pay | Admitting: Specialist

## 2022-05-29 ENCOUNTER — Ambulatory Visit (HOSPITAL_COMMUNITY): Payer: BC Managed Care – PPO

## 2022-05-29 ENCOUNTER — Ambulatory Visit (HOSPITAL_COMMUNITY)
Admission: RE | Admit: 2022-05-29 | Discharge: 2022-05-29 | Disposition: A | Discharge status: Home / Self Care | Payer: BC Managed Care – PPO | Attending: Specialist | Admitting: Specialist

## 2022-05-29 DIAGNOSIS — F419 Anxiety disorder, unspecified: Secondary | ICD-10-CM | POA: Diagnosis not present

## 2022-05-29 DIAGNOSIS — Z87891 Personal history of nicotine dependence: Secondary | ICD-10-CM | POA: Insufficient documentation

## 2022-05-29 DIAGNOSIS — M48061 Spinal stenosis, lumbar region without neurogenic claudication: Secondary | ICD-10-CM | POA: Diagnosis not present

## 2022-05-29 DIAGNOSIS — M5126 Other intervertebral disc displacement, lumbar region: Secondary | ICD-10-CM | POA: Diagnosis present

## 2022-05-29 DIAGNOSIS — M21372 Foot drop, left foot: Secondary | ICD-10-CM | POA: Diagnosis not present

## 2022-05-29 HISTORY — PX: LUMBAR LAMINECTOMY/DECOMPRESSION MICRODISCECTOMY: SHX5026

## 2022-05-29 SURGERY — LUMBAR LAMINECTOMY/DECOMPRESSION MICRODISCECTOMY 1 LEVEL
Anesthesia: General

## 2022-05-29 MED ORDER — OXYCODONE HCL 5 MG PO TABS
ORAL_TABLET | ORAL | Status: AC
  Filled 2022-05-29: qty 1

## 2022-05-29 MED ORDER — METHOCARBAMOL 500 MG PO TABS
500.0000 mg | ORAL_TABLET | Freq: Four times a day (QID) | ORAL | Status: DC | PRN
  Administered 2022-05-29: 500 mg via ORAL
  Filled 2022-05-29: qty 1

## 2022-05-29 MED ORDER — ACETAMINOPHEN 10 MG/ML IV SOLN
1000.0000 mg | INTRAVENOUS | Status: AC
  Administered 2022-05-29: 1000 mg via INTRAVENOUS
  Filled 2022-05-29: qty 100

## 2022-05-29 MED ORDER — GABAPENTIN 300 MG PO CAPS: 300.0000 mg | ORAL_CAPSULE | Freq: Every evening | ORAL | Status: DC | PRN

## 2022-05-29 MED ORDER — BUPIVACAINE-EPINEPHRINE 0.5% -1:200000 IJ SOLN
INTRAMUSCULAR | Status: DC | PRN
  Administered 2022-05-29: 5 mL

## 2022-05-29 MED ORDER — OXYCODONE HCL 5 MG PO TABS
5.0000 mg | ORAL_TABLET | ORAL | Status: DC | PRN
  Administered 2022-05-29: 5 mg via ORAL

## 2022-05-29 MED ORDER — MENTHOL 3 MG MT LOZG: 1.0000 | LOZENGE | OROMUCOSAL | Status: DC | PRN

## 2022-05-29 MED ORDER — METHOCARBAMOL 1000 MG/10ML IJ SOLN: 500.0000 mg | Freq: Four times a day (QID) | INTRAVENOUS | Status: DC | PRN

## 2022-05-29 MED ORDER — PROPOFOL 10 MG/ML IV BOLUS
INTRAVENOUS | Status: AC
  Filled 2022-05-29: qty 20

## 2022-05-29 MED ORDER — ACETAMINOPHEN 650 MG RE SUPP: 650.0000 mg | RECTAL | Status: DC | PRN

## 2022-05-29 MED ORDER — LIDOCAINE 2% (20 MG/ML) 5 ML SYRINGE
INTRAMUSCULAR | Status: DC | PRN
  Administered 2022-05-29: 60 mg via INTRAVENOUS

## 2022-05-29 MED ORDER — DOCUSATE SODIUM 100 MG PO CAPS: 100.0000 mg | ORAL_CAPSULE | Freq: Two times a day (BID) | ORAL | 1 refills | Status: DC | PRN

## 2022-05-29 MED ORDER — ACETAMINOPHEN 325 MG PO TABS: 325.0000 mg | ORAL_TABLET | Freq: Once | ORAL | Status: DC | PRN

## 2022-05-29 MED ORDER — PROMETHAZINE HCL 25 MG/ML IJ SOLN: 6.2500 mg | INTRAMUSCULAR | Status: DC | PRN

## 2022-05-29 MED ORDER — ORAL CARE MOUTH RINSE: 15.0000 mL | Freq: Once | OROMUCOSAL | Status: AC

## 2022-05-29 MED ORDER — PHENYLEPHRINE 80 MCG/ML (10ML) SYRINGE FOR IV PUSH (FOR BLOOD PRESSURE SUPPORT)
PREFILLED_SYRINGE | INTRAVENOUS | Status: DC | PRN
  Administered 2022-05-29 (×2): 40 ug via INTRAVENOUS
  Administered 2022-05-29: 80 ug via INTRAVENOUS

## 2022-05-29 MED ORDER — AMISULPRIDE (ANTIEMETIC) 5 MG/2ML IV SOLN: 10.0000 mg | Freq: Once | INTRAVENOUS | Status: DC | PRN

## 2022-05-29 MED ORDER — POLYETHYLENE GLYCOL 3350 17 G PO PACK: 17.0000 g | PACK | Freq: Every day | ORAL | Status: DC | PRN

## 2022-05-29 MED ORDER — ONDANSETRON HCL 4 MG/2ML IJ SOLN
INTRAMUSCULAR | Status: DC | PRN
  Administered 2022-05-29: 4 mg via INTRAVENOUS

## 2022-05-29 MED ORDER — OXYCODONE HCL 5 MG PO TABS: 10.0000 mg | ORAL_TABLET | ORAL | Status: DC | PRN

## 2022-05-29 MED ORDER — MIDAZOLAM HCL 2 MG/2ML IJ SOLN
INTRAMUSCULAR | Status: DC | PRN
  Administered 2022-05-29: 2 mg via INTRAVENOUS

## 2022-05-29 MED ORDER — ONDANSETRON HCL 4 MG/2ML IJ SOLN: 4.0000 mg | Freq: Four times a day (QID) | INTRAMUSCULAR | Status: DC | PRN

## 2022-05-29 MED ORDER — POLYETHYLENE GLYCOL 3350 17 G PO PACK: 17.0000 g | PACK | Freq: Every day | ORAL | 0 refills | Status: DC

## 2022-05-29 MED ORDER — THROMBIN 20000 UNITS EX SOLR: CUTANEOUS | Status: DC | PRN

## 2022-05-29 MED ORDER — HYDROMORPHONE HCL 1 MG/ML IJ SOLN: 1.0000 mg | INTRAMUSCULAR | Status: DC | PRN

## 2022-05-29 MED ORDER — BISACODYL 5 MG PO TBEC: 5.0000 mg | DELAYED_RELEASE_TABLET | Freq: Every day | ORAL | Status: DC | PRN

## 2022-05-29 MED ORDER — LACTATED RINGERS IV SOLN: INTRAVENOUS | Status: DC

## 2022-05-29 MED ORDER — SUGAMMADEX SODIUM 200 MG/2ML IV SOLN
INTRAVENOUS | Status: DC | PRN
  Administered 2022-05-29: 200 mg via INTRAVENOUS

## 2022-05-29 MED ORDER — ALUM & MAG HYDROXIDE-SIMETH 200-200-20 MG/5ML PO SUSP: 30.0000 mL | Freq: Four times a day (QID) | ORAL | Status: DC | PRN

## 2022-05-29 MED ORDER — CHLORHEXIDINE GLUCONATE 0.12 % MT SOLN
15.0000 mL | Freq: Once | OROMUCOSAL | Status: AC
  Administered 2022-05-29: 15 mL via OROMUCOSAL
  Filled 2022-05-29: qty 15

## 2022-05-29 MED ORDER — GENTAMICIN IN SALINE 1-0.9 MG/ML-% IV SOLN
100.0000 mg | INTRAVENOUS | Status: AC
  Administered 2022-05-29: 100 mg via INTRAVENOUS
  Filled 2022-05-29 (×2): qty 100

## 2022-05-29 MED ORDER — VITAMIN D 25 MCG (1000 UNIT) PO TABS: 2000.0000 [IU] | ORAL_TABLET | Freq: Every day | ORAL | Status: DC

## 2022-05-29 MED ORDER — HYDROMORPHONE HCL 1 MG/ML IJ SOLN: 0.2500 mg | INTRAMUSCULAR | Status: DC | PRN

## 2022-05-29 MED ORDER — THROMBIN 20000 UNITS EX SOLR
CUTANEOUS | Status: AC
  Filled 2022-05-29: qty 20000

## 2022-05-29 MED ORDER — RISAQUAD PO CAPS
1.0000 | ORAL_CAPSULE | Freq: Every day | ORAL | Status: DC
  Administered 2022-05-29: 1 via ORAL
  Filled 2022-05-29: qty 1

## 2022-05-29 MED ORDER — ACETAMINOPHEN 10 MG/ML IV SOLN: 1000.0000 mg | Freq: Once | INTRAVENOUS | Status: DC | PRN

## 2022-05-29 MED ORDER — VANCOMYCIN HCL 1500 MG/300ML IV SOLN
1500.0000 mg | INTRAVENOUS | Status: AC
  Administered 2022-05-29: 1500 mg via INTRAVENOUS
  Filled 2022-05-29 (×2): qty 300

## 2022-05-29 MED ORDER — ONDANSETRON HCL 4 MG PO TABS: 4.0000 mg | ORAL_TABLET | Freq: Four times a day (QID) | ORAL | Status: DC | PRN

## 2022-05-29 MED ORDER — ACETAMINOPHEN 325 MG PO TABS: 650.0000 mg | ORAL_TABLET | ORAL | Status: DC | PRN

## 2022-05-29 MED ORDER — OXYCODONE HCL 10 MG PO TABS: 10.0000 mg | ORAL_TABLET | ORAL | 0 refills | Status: AC | PRN

## 2022-05-29 MED ORDER — LORATADINE 10 MG PO TABS
10.0000 mg | ORAL_TABLET | Freq: Every day | ORAL | Status: DC
  Administered 2022-05-29: 10 mg via ORAL
  Filled 2022-05-29: qty 1

## 2022-05-29 MED ORDER — ACETAMINOPHEN 160 MG/5ML PO SOLN: 325.0000 mg | Freq: Once | ORAL | Status: DC | PRN

## 2022-05-29 MED ORDER — ROCURONIUM BROMIDE 10 MG/ML (PF) SYRINGE
PREFILLED_SYRINGE | INTRAVENOUS | Status: DC | PRN
  Administered 2022-05-29 (×2): 10 mg via INTRAVENOUS
  Administered 2022-05-29: 70 mg via INTRAVENOUS
  Administered 2022-05-29: 10 mg via INTRAVENOUS

## 2022-05-29 MED ORDER — FENTANYL CITRATE (PF) 250 MCG/5ML IJ SOLN
INTRAMUSCULAR | Status: DC | PRN
  Administered 2022-05-29 (×5): 50 ug via INTRAVENOUS

## 2022-05-29 MED ORDER — BUPIVACAINE-EPINEPHRINE (PF) 0.25% -1:200000 IJ SOLN
INTRAMUSCULAR | Status: AC
  Filled 2022-05-29: qty 30

## 2022-05-29 MED ORDER — FLUTICASONE PROPIONATE 50 MCG/ACT NA SUSP: 2.0000 | Freq: Every day | NASAL | Status: DC | PRN

## 2022-05-29 MED ORDER — PHENOL 1.4 % MT LIQD: 1.0000 | OROMUCOSAL | Status: DC | PRN

## 2022-05-29 MED ORDER — PROPOFOL 10 MG/ML IV BOLUS
INTRAVENOUS | Status: DC | PRN
  Administered 2022-05-29: 200 mg via INTRAVENOUS

## 2022-05-29 MED ORDER — TRANEXAMIC ACID-NACL 1000-0.7 MG/100ML-% IV SOLN
1000.0000 mg | INTRAVENOUS | Status: AC
  Administered 2022-05-29: 1000 mg via INTRAVENOUS
  Filled 2022-05-29: qty 100

## 2022-05-29 MED ORDER — 0.9 % SODIUM CHLORIDE (POUR BTL) OPTIME
TOPICAL | Status: DC | PRN
  Administered 2022-05-29: 1000 mL

## 2022-05-29 MED ORDER — MAGNESIUM CITRATE PO SOLN: 1.0000 | Freq: Once | ORAL | Status: DC | PRN

## 2022-05-29 MED ORDER — KCL IN DEXTROSE-NACL 20-5-0.45 MEQ/L-%-% IV SOLN
INTRAVENOUS | Status: DC
  Filled 2022-05-29: qty 1000

## 2022-05-29 MED ORDER — DEXAMETHASONE SODIUM PHOSPHATE 10 MG/ML IJ SOLN
INTRAMUSCULAR | Status: DC | PRN
  Administered 2022-05-29: 10 mg via INTRAVENOUS

## 2022-05-29 MED ORDER — PHENYLEPHRINE 80 MCG/ML (10ML) SYRINGE FOR IV PUSH (FOR BLOOD PRESSURE SUPPORT)
PREFILLED_SYRINGE | INTRAVENOUS | Status: AC
  Filled 2022-05-29: qty 10

## 2022-05-29 MED ORDER — ROBAXIN-750 750 MG PO TABS: 750.0000 mg | ORAL_TABLET | Freq: Three times a day (TID) | ORAL | 1 refills | Status: DC | PRN

## 2022-05-29 MED ORDER — DOCUSATE SODIUM 100 MG PO CAPS
100.0000 mg | ORAL_CAPSULE | Freq: Two times a day (BID) | ORAL | Status: DC
  Administered 2022-05-29: 100 mg via ORAL
  Filled 2022-05-29: qty 1

## 2022-05-29 MED ORDER — FENTANYL CITRATE (PF) 250 MCG/5ML IJ SOLN
INTRAMUSCULAR | Status: AC
  Filled 2022-05-29: qty 5

## 2022-05-29 MED ORDER — MIDAZOLAM HCL 2 MG/2ML IJ SOLN
INTRAMUSCULAR | Status: AC
  Filled 2022-05-29: qty 2

## 2022-05-29 MED ORDER — VANCOMYCIN HCL 1500 MG/300ML IV SOLN
1500.0000 mg | Freq: Once | INTRAVENOUS | Status: DC
  Filled 2022-05-29: qty 300

## 2022-05-29 MED ORDER — MEPERIDINE HCL 25 MG/ML IJ SOLN: 6.2500 mg | INTRAMUSCULAR | Status: DC | PRN

## 2022-05-29 SURGICAL SUPPLY — 59 items
BAG COUNTER SPONGE SURGICOUNT (BAG) ×1 IMPLANT
BAG DECANTER FOR FLEXI CONT (MISCELLANEOUS) IMPLANT
BAG SPNG CNTER NS LX DISP (BAG) ×1
BAND INSRT 18 STRL LF DISP RB (MISCELLANEOUS) ×2
BAND RUBBER #18 3X1/16 STRL (MISCELLANEOUS) ×2 IMPLANT
BUR EGG ELITE 5.0 (BURR) IMPLANT
BUR RND DIAMOND ELITE 4.0 (BURR) IMPLANT
CLEANER TIP ELECTROSURG 2X2 (MISCELLANEOUS) ×1 IMPLANT
CNTNR URN SCR LID CUP LEK RST (MISCELLANEOUS) ×1 IMPLANT
CONT SPEC 4OZ STRL OR WHT (MISCELLANEOUS) ×1
DRAPE LAPAROTOMY 100X72X124 (DRAPES) ×1 IMPLANT
DRAPE MICROSCOPE SLANT 54X150 (MISCELLANEOUS) ×1 IMPLANT
DRAPE SHEET LG 3/4 BI-LAMINATE (DRAPES) ×1 IMPLANT
DRAPE SURG 17X11 SM STRL (DRAPES) ×1 IMPLANT
DRAPE UTILITY XL STRL (DRAPES) ×1 IMPLANT
DRSG AQUACEL AG ADV 3.5X 4 (GAUZE/BANDAGES/DRESSINGS) IMPLANT
DRSG AQUACEL AG ADV 3.5X 6 (GAUZE/BANDAGES/DRESSINGS) IMPLANT
DRSG TELFA 3X8 NADH STRL (GAUZE/BANDAGES/DRESSINGS) IMPLANT
DURAPREP 26ML APPLICATOR (WOUND CARE) ×1 IMPLANT
DURASEAL SPINE SEALANT 3ML (MISCELLANEOUS) IMPLANT
ELECT BLADE 4.0 EZ CLEAN MEGAD (MISCELLANEOUS)
ELECT REM PT RETURN 9FT ADLT (ELECTROSURGICAL) ×1
ELECTRODE BLDE 4.0 EZ CLN MEGD (MISCELLANEOUS) IMPLANT
ELECTRODE REM PT RTRN 9FT ADLT (ELECTROSURGICAL) ×1 IMPLANT
GLOVE BIOGEL PI IND STRL 7.5 (GLOVE) ×1 IMPLANT
GLOVE SURG SS PI 7.0 STRL IVOR (GLOVE) ×1 IMPLANT
GLOVE SURG SS PI 8.0 STRL IVOR (GLOVE) ×2 IMPLANT
GOWN STRL REUS W/ TWL LRG LVL3 (GOWN DISPOSABLE) ×1 IMPLANT
GOWN STRL REUS W/ TWL XL LVL3 (GOWN DISPOSABLE) ×1 IMPLANT
GOWN STRL REUS W/TWL LRG LVL3 (GOWN DISPOSABLE) ×1
GOWN STRL REUS W/TWL XL LVL3 (GOWN DISPOSABLE) ×2
IV CATH 14GX2 1/4 (CATHETERS) ×1 IMPLANT
KIT BASIN OR (CUSTOM PROCEDURE TRAY) ×1 IMPLANT
NDL 22X1.5 STRL (OR ONLY) (MISCELLANEOUS) ×1 IMPLANT
NDL SPNL 18GX3.5 QUINCKE PK (NEEDLE) ×2 IMPLANT
NEEDLE 22X1.5 STRL (OR ONLY) (MISCELLANEOUS) ×1 IMPLANT
NEEDLE SPNL 18GX3.5 QUINCKE PK (NEEDLE) ×2 IMPLANT
PACK LAMINECTOMY NEURO (CUSTOM PROCEDURE TRAY) ×1 IMPLANT
PATTIES SURGICAL .25X.25 (GAUZE/BANDAGES/DRESSINGS) IMPLANT
PATTIES SURGICAL .75X.75 (GAUZE/BANDAGES/DRESSINGS) ×1 IMPLANT
SOLUTION PRONTOSAN WOUND 350ML (IRRIGATION / IRRIGATOR) IMPLANT
SPONGE SURGIFOAM ABS GEL 100 (HEMOSTASIS) ×1 IMPLANT
SPONGE T-LAP 4X18 ~~LOC~~+RFID (SPONGE) IMPLANT
STAPLER VISISTAT (STAPLE) IMPLANT
STRIP CLOSURE SKIN 1/2X4 (GAUZE/BANDAGES/DRESSINGS) ×1 IMPLANT
SUT NURALON 4 0 TR CR/8 (SUTURE) IMPLANT
SUT PROLENE 3 0 PS 2 (SUTURE) IMPLANT
SUT VIC AB 1 CT1 27 (SUTURE)
SUT VIC AB 1 CT1 27XBRD ANTBC (SUTURE) IMPLANT
SUT VIC AB 1-0 CT2 27 (SUTURE) IMPLANT
SUT VIC AB 2-0 CT1 27 (SUTURE) ×1
SUT VIC AB 2-0 CT1 TAPERPNT 27 (SUTURE) IMPLANT
SUT VIC AB 2-0 CT2 27 (SUTURE) IMPLANT
SYR 3ML LL SCALE MARK (SYRINGE) ×1 IMPLANT
TOWEL GREEN STERILE (TOWEL DISPOSABLE) ×1 IMPLANT
TOWEL GREEN STERILE FF (TOWEL DISPOSABLE) ×1 IMPLANT
TRAY FOLEY MTR SLVR 16FR STAT (SET/KITS/TRAYS/PACK) ×1 IMPLANT
WIPE CHG 2% 2PK PREOPERATIVE (MISCELLANEOUS) ×1 IMPLANT
YANKAUER SUCT BULB TIP NO VENT (SUCTIONS) ×1 IMPLANT

## 2022-05-29 NOTE — Evaluation (Signed)
Occupational Therapy Evaluation Patient Details Name: Jackson Mitchell MRN: [id number] DOB: 11-Mar-1991 Today's Date: 05/29/2022   History of Present Illness 32 yo M adm 3/14 for scheduled hemilaminotomy, foraminotomy, and microdiscectomy L4-5, L5-S1 Left.   Clinical Impression   Patient admitted for the procedure above.  PTA he lives at home with his spouse, and continues to work full time, and needed no assist with any aspect of mobility, ADL, or iADL.  Patient expresses soreness to operative site, and a little numbness to his L leg, but he is doing very well.  Patient is up and walking the halls independently, and needed no assist with ADL from a sit to stand level.  Patient verbalizes understanding of back precautions, and will have the needed assist at home if needed.  No further OT needs in the acute setting and no post acute OT anticipated.  Recommend follow up as prescribed by MD.        Recommendations for follow up therapy are one component of a multi-disciplinary discharge planning process, led by the attending physician.  Recommendations may be updated based on patient status, additional functional criteria and insurance authorization.   Follow Up Recommendations  No OT follow up     Assistance Recommended at Discharge PRN  Patient can return home with the following Assist for transportation    Functional Status Assessment  Patient has not had a recent decline in their functional status  Equipment Recommendations  None recommended by OT    Recommendations for Other Services       Precautions / Restrictions Precautions Precautions: Back Precaution Booklet Issued: Yes (comment) Precaution Comments: verbalized understanding Restrictions Weight Bearing Restrictions: No      Mobility Bed Mobility Overal bed mobility: Independent                  Transfers Overall transfer level: Independent                        Balance Overall balance assessment:  No apparent balance deficits (not formally assessed)                                         ADL either performed or assessed with clinical judgement   ADL Overall ADL's : At baseline                                             Vision Patient Visual Report: No change from baseline       Perception     Praxis      Pertinent Vitals/Pain Pain Assessment Pain Assessment: Faces Faces Pain Scale: Hurts a little bit Pain Location: Incision Pain Descriptors / Indicators: Operative site guarding Pain Intervention(s): Monitored during session     Hand Dominance Right   Extremity/Trunk Assessment Upper Extremity Assessment Upper Extremity Assessment: Overall WFL for tasks assessed   Lower Extremity Assessment Lower Extremity Assessment: Overall WFL for tasks assessed   Cervical / Trunk Assessment Cervical / Trunk Assessment: Normal   Communication Communication Communication: No difficulties   Cognition Arousal/Alertness: Awake/alert Behavior During Therapy: WFL for tasks assessed/performed Overall Cognitive Status: Within Functional Limits for tasks assessed  General Comments   VSS on RA    Exercises     Shoulder Instructions      Home Living Family/patient expects to be discharged to:: Private residence Living Arrangements: Spouse/significant other;Children Available Help at Discharge: Family;Available 24 hours/day Type of Home: House Home Access: Level entry     Home Layout: One level     Bathroom Shower/Tub: Occupational psychologist: Standard Bathroom Accessibility: Yes How Accessible: Accessible via walker Home Equipment: [place] (2 wheels);Shower seat          Prior Functioning/Environment Prior Level of Function : Independent/Modified Independent;Driving;Working/employed                        OT Problem List: Pain      OT  Treatment/Interventions:      OT Goals(Current goals can be found in the care plan section) Acute Rehab OT Goals Patient Stated Goal: Return home OT Goal Formulation: With patient Time For Goal Achievement: 06/02/22 Potential to Achieve Goals: Good  OT Frequency:      Co-evaluation              AM-PAC OT "6 Clicks" Daily Activity     Outcome Measure Help from another person eating meals?: None Help from another person taking care of personal grooming?: None Help from another person toileting, which includes using toliet, bedpan, or urinal?: None Help from another person bathing (including washing, rinsing, drying)?: None Help from another person to put on and taking off regular upper body clothing?: None Help from another person to put on and taking off regular lower body clothing?: None 6 Click Score: 24   End of Session Nurse Communication: Mobility status  Activity Tolerance: Patient tolerated treatment well Patient left: in bed;with call bell/phone within reach  OT Visit Diagnosis: Unsteadiness on feet (R26.81)                Time: [id number] OT Time Calculation (min): 22 min Charges:  OT General Charges $OT Visit: 1 Visit OT Evaluation $OT Eval Moderate Complexity: 1 Mod  05/29/2022  RP, OTR/L  Acute Rehabilitation Services  Office:  [phone]   Metta Clines 05/29/2022, 4:42 PM

## 2022-05-29 NOTE — Anesthesia Preprocedure Evaluation (Addendum)
Anesthesia Evaluation  Patient identified by MRN, date of birth, ID band Patient awake    Reviewed: Allergy & Precautions, NPO status , Patient's Chart, lab work & pertinent test results  Airway Mallampati: I  TM Distance: >3 FB Neck ROM: Full    Dental  (+) Teeth Intact, Dental Advisory Given   Pulmonary former smoker   breath sounds clear to auscultation       Cardiovascular negative cardio ROS  Rhythm:Regular Rate:Normal     Neuro/Psych   Anxiety        GI/Hepatic negative GI ROS, Neg liver ROS,,,  Endo/Other  negative endocrine ROS    Renal/GU negative Renal ROS     Musculoskeletal negative musculoskeletal ROS (+)    Abdominal   Peds  Hematology negative hematology ROS (+)   Anesthesia Other Findings   Reproductive/Obstetrics                             Anesthesia Physical Anesthesia Plan  ASA: 2  Anesthesia Plan: General   Post-op Pain Management: Tylenol PO (pre-op)*   Induction: Intravenous  PONV Risk Score and Plan: 3  Airway Management Planned: Oral ETT  Additional Equipment: None  Intra-op Plan:   Post-operative Plan: Extubation in OR  Informed Consent: I have reviewed the patients History and Physical, chart, labs and discussed the procedure including the risks, benefits and alternatives for the proposed anesthesia with the patient or authorized representative who has indicated his/her understanding and acceptance.     Dental advisory given  Plan Discussed with: CRNA  Anesthesia Plan Comments:        Anesthesia Quick Evaluation

## 2022-05-29 NOTE — Interval H&P Note (Signed)
History and Physical Interval Note:  05/29/2022 9:48 AM  Jackson Mitchell  has presented today for surgery, with the diagnosis of Herniated disc L4-5, L5-S1 Left.  The various methods of treatment have been discussed with the patient and family. After consideration of risks, benefits and other options for treatment, the patient has consented to  Procedure(s): Microdiscectomy L4-5, L5-S1 left (N/A) as a surgical intervention.  The patient's history has been reviewed, patient examined, no change in status, stable for surgery.  I have reviewed the patient's chart and labs.  Questions were answered to the patient's satisfaction.     Johnn Hai

## 2022-05-29 NOTE — Discharge Instructions (Signed)

## 2022-05-29 NOTE — Anesthesia Postprocedure Evaluation (Signed)
Anesthesia Post Note  Patient: Jackson Mitchell  Procedure(s) Performed: Microdiscectomy Lumbar Four-Lumbar Five, Lumbar Five-Sacral One Left     Patient location during evaluation: PACU Anesthesia Type: General Level of consciousness: awake and alert Pain management: pain level controlled Vital Signs Assessment: post-procedure vital signs reviewed and stable Respiratory status: spontaneous breathing, nonlabored ventilation, respiratory function stable and patient connected to nasal cannula oxygen Cardiovascular status: blood pressure returned to baseline and stable Postop Assessment: no apparent nausea or vomiting Anesthetic complications: no  No notable events documented.  Last Vitals:  Vitals:   05/29/22 1345 05/29/22 1400  BP: 122/82 115/73  Pulse: 95 94  Resp: 13 14  Temp:  36.9 C  SpO2: 96% 97%    Last Pain:  Vitals:   05/29/22 1400  TempSrc:   PainSc: 0-No pain                 Effie Berkshire

## 2022-05-29 NOTE — Anesthesia Procedure Notes (Signed)
Procedure Name: Intubation Date/Time: 05/29/2022 10:36 AM  Performed by: Terrence Dupont, CRNAPre-anesthesia Checklist: Patient identified, Emergency Drugs available, Suction available and Patient being monitored Patient Re-evaluated:Patient Re-evaluated prior to induction Oxygen Delivery Method: Circle system utilized Preoxygenation: Pre-oxygenation with 100% oxygen Induction Type: IV induction Ventilation: Mask ventilation without difficulty Laryngoscope Size: Mac and 4 Grade View: Grade I Tube type: Oral Tube size: 7.5 mm Number of attempts: 1 Airway Equipment and Method: Stylet and Oral airway Placement Confirmation: ETT inserted through vocal cords under direct vision, positive ETCO2 and breath sounds checked- equal and bilateral Tube secured with: Tape Dental Injury: Teeth and Oropharynx as per pre-operative assessment

## 2022-05-29 NOTE — Transfer of Care (Signed)
Immediate Anesthesia Transfer of Care Note  Patient: Lowell Guitar  Procedure(s) Performed: Microdiscectomy Lumbar Four-Lumbar Five, Lumbar Five-Sacral One Left  Patient Location: PACU  Anesthesia Type:General  Level of Consciousness: awake and alert   Airway & Oxygen Therapy: Patient Spontanous Breathing and Patient connected to face mask oxygen  Post-op Assessment: Report given to RN and Post -op Vital signs reviewed and stable  Post vital signs: Reviewed and stable  Last Vitals:  Vitals Value Taken Time  BP 128/73 05/29/22 1309  Temp    Pulse 111 05/29/22 1311  Resp 23 05/29/22 1311  SpO2 99 % 05/29/22 1311  Vitals shown include unvalidated device data.  Last Pain:  Vitals:   05/29/22 0930  TempSrc:   PainSc: 1       Patients Stated Pain Goal: 1 ([id number])  Complications: No notable events documented.

## 2022-05-29 NOTE — Op Note (Signed)
Jackson Mitchell, Jackson Mitchell MEDICAL RECORD NO: [id number] ACCOUNT NO: [id number] DATE OF BIRTH: [birth year] FACILITY: [facility] LOCATION: [facility] PHYSICIAN: Johnn Hai, MD  Operative Report   DATE OF PROCEDURE: 05/29/2022  PREOPERATIVE DIAGNOSIS:  Spinal stenosis, HNP at L4-L5.  POSTOPERATIVE DIAGNOSES:  Spinal stenosis, HNP at L4-L5, extensive epidural venous plexus L4-L5, left.  PROCEDURE PERFORMED:   1.  Left hemilaminotomy L4-L5, foraminotomy at L5. 2.  Microdiskectomy L4-L5. 3.  Lysis of extensive epidural venous plexus.  ANESTHESIA:  General.  ASSISTANT:  Lacie Draft, PA.  HISTORY:  This is a 32 year old male disk herniation at L4-L5, migrating caudad to below the L5 lamina.  He had a partial foot drop and is indicated for a decompression, microdiskectomy and foraminotomy, possible hemilaminotomy and extension into the  L5-S1 disk space.  Risks and benefits discussed including bleeding, infection, damage to neurovascular structures, no change in symptoms, worsening symptoms, DVT, PE, anesthetic complications, etc.  DESCRIPTION OF PROCEDURE:  With the patient in supine position, after induction of adequate general anesthesia, vancomycin and gentamicin for antimicrobial prophylaxis the patient was placed prone on the Wilson frame.  All bony prominences well padded.   Lumbar region was prepped and draped in the usual sterile fashion.  Two 18-gauge spinal needles was utilized to localize L4-L5 interspace, confirmed with x-ray.  Incision was made from the spinous process 4-5.  Subcutaneous tissue was dissected.   Electrocautery was utilized to achieve hemostasis.  Dorsal lumbar fascia divided in line with skin incision.  Paraspinous muscle elevated from lamina L4-L5.  McCulloch retractor was placed.  Operating microscope was draped and brought in the surgical  field.  Confirmatory radiograph obtained.  Small interlaminar window was noted.  A high-speed bur was utilized to perform  hemilaminotomies of the caudad edge of L4 preserving ample pars.  In addition, there was tingling of the L5 lamina.  I used a  high-speed bur to remove a portion of the L5 lamina.  There was a small interlaminar window.  I used a Penfield and a small straight curette to detach ligamentum flavum from the cephalad edge of L5 and superior articulating process of L5.  I then used a  2 mm Kerrison to begin a hemilaminotomy.  I removed ligamentum flavum from the interspace.  I then performed a foraminotomy protecting the neural elements.  I identified L5 nerve root, which was extremely tented over the disk herniation that had gone  caudad from the disk space.  I decompressed the lateral recess to the medial border of the pedicle.  Electrocautery an extensive epidural venous plexus, which was isolated, cauterized, and divided.  I then traced the nerve root back up to the disk space  at L4-L5.  There were epidural venous plexus that was encountered and cauterized.  I then isolated. Thrombin-soaked Gelfoam was placed over that.  With gentle mobilization of the L5 nerve root beneath that there was a large extruded fragment.  This was  gently mobilized with a nerve hook and a micropituitary.  Two large fragments were initially removed further mobilizing the L5 root gaining further access to the disk herniation and pseudocapsule.  This was gently mobilized the 5 root medially.  I then  further mobilized additional fragments.  They were beneath the thecal sac extending up to the disk space.  There was adhesions noted that were gently lysed.  These fragments were excised up to the disk space.  I then performed an annulotomy.  Copious  portion of disk material was removed from  the disk space with micropituitary further mobilized with Epstein preserving the endplates.  It was irrigated with catheter lavage and additional fragments were excised.  There was additional epidural bleeding  cephalad.  This mobilized the thecal  sac, cauterized an additional venous plexus.  I then placed thrombin-soaked Gelfoam over the venous plexus.  Obtained a confirmatory radiograph at L4-L5.  We felt the entirety of the fragment was removed.  We  therefore did not have to remove the whole hemilamina extending down to the disk space at L5-S1.  Woodson probe passed freely out the foramen of L5.  There was 1 cm of excursion of the L5 root medial pedicle without tension.  A neuro probe passed freely  out the foramen of L4 as well as checked beneath the thecal sac, the axilla and the shoulder of the root without residual disk herniation noted.  There is no evidence of CSF leakage or active bleeding noted.  I copiously irrigated the wound.  I removed  the Gelfoam and then placed a very small piece of Gelfoam 2 x 2 mm at the disk space, as to the shoulder of the nerve root where the epidural venous plexus.  I copiously irrigated the wound.  Inspection revealed no evidence of CSF leakage or active  bleeding.  I therefore removed the McCulloch retractor, irrigated the paraspinous musculature.  No active bleeding.  Bipolar cautery was utilized to achieve hemostasis for any minor bleeding.  I repaired the dorsal lumbar fascia with #1 Vicryl in  interrupted figure-of-eight sutures, subcutaneous with 2-0 and skin with subcuticular Prolene.  Sterile dressing applied.  Placed supine on the hospital bed, extubated without difficulty and transported to the recovery room in satisfactory condition.  The patient tolerated the procedure well.  No complications.  ASSISTANT:  Lacie Draft, PA.  BLOOD LOSS:  100 mL.  Technical difficulty increased due to the patient's migration of the disk herniation caudad, adhesions, extensive epidural venous plexus.  The infected L5 nerve root was intact, was erythematous and edematous however from its compression.   PUS D: 05/29/2022 1:09:01 pm T: 05/29/2022 1:31:00 pm  JOB: [id number]

## 2022-05-29 NOTE — Progress Notes (Signed)
Patient alert and oriented, mae's well, voiding adequate amount of urine, swallowing without difficulty, no c/o pain at time of discharge. Patient discharged home with family. Script and discharged instructions given to patient. Patient and family stated understanding of instructions given. Patient has an appointment with Dr. Beane ?

## 2022-05-29 NOTE — Plan of Care (Signed)

## 2022-05-29 NOTE — Progress Notes (Signed)
BRIEF PHARMACY MONITORING NOTE  Jackson Mitchell is a 32 yo male s/p hemilaminotomy, foraminotomy, and microdiscectomy on 05/29/22.  Vancomycin '1500mg'$  IV x1 given pre-op. Pharmacy consulted to dose vancomycin for one dose post-op.   Plan: Vancomycin '1500mg'$  IV x1 12h after pre-op dose given  Will formally sign off consult but will continue to monitor patient peripherally.  Dimple Nanas, PharmD, BCPS 05/29/2022 3:05 PM

## 2022-05-29 NOTE — Brief Op Note (Signed)
05/29/2022  9:48 AM  PATIENT:  Jackson Mitchell  32 y.o. male  PRE-OPERATIVE DIAGNOSIS:  Herniated disc L4-5, L5-S1 Left  POST-OPERATIVE DIAGNOSIS:  * No post-op diagnosis entered *  PROCEDURE:  Procedure(s): Microdiscectomy L4-5, L5-S1 left (N/A)  SURGEON:  Surgeon(s) and Role:    Susa Day, MD - Primary  PHYSICIAN ASSISTANT:   ASSISTANTS: Bissell   ANESTHESIA:   general  EBL:  100   BLOOD ADMINISTERED:none  DRAINS: none   LOCAL MEDICATIONS USED:  MARCAINE     SPECIMEN:  No Specimen  DISPOSITION OF SPECIMEN:      COUNTS:  YES  TOURNIQUET:  * No tourniquets in log *  DICTATION: .Other Dictation: Dictation Number [id number]  PLAN OF CARE: Admit for overnight observation  PATIENT DISPOSITION:  PACU - hemodynamically stable.   Delay start of Pharmacological VTE agent (>24hrs) due to surgical blood loss or risk of bleeding: yes

## 2022-05-30 ENCOUNTER — Encounter (HOSPITAL_COMMUNITY): Payer: Self-pay | Admitting: Specialist

## 2022-07-23 NOTE — Progress Notes (Unsigned)
Referring Provider: Vivien Presto, MD Primary Care Physician:  Vivien Presto, MD Primary Gastroenterologist:  Dr. Marletta Lor  Chief Complaint  Patient presents with   labs elevated     Elevated labs.     HPI:   Jackson Mitchell is a 32 y.o. male presenting today at the request of Corrington, Kip A, MD for elevated liver enzymes.   In December 2019, alk phos 121 (H), ALT 53 (H), AST 31. Most recent labs 07/03/2022 with alk phos 133 (H), AST 54 (H), ALT 95 (H). No abdominal imaging on file.  Today:  Alcohol use: 1-2 glasses of bourbon/month. 2020-2022, would drink a couple drinks a week.  Illicit drug use: None.  Over-the-counter supplements/herbal teas: None. Vitamin D here and there.  Tylenol/ibuprofen/meloxicam:  Intermittently for HA or back issues. Had back surgery in March, so infrequent as this point.  Family history of liver disease/autoimmune conditions: None.   Put on 30 lbs since middle of last year due to torn ACL and back pain. Had back surgery in March 2024.  This is why he recently started phentermine.  Started Clomid last year for low testosterone, but now off.   The eyes abdominal distention, lower extremity edema, yellowing of the eyes or skin, bruising/bleeding, mental status changes, abdominal pain, BRBPR, melena.  Bowels moving well.  Notices reflux symptoms when he is heavier and if he is drinking a lot of soda.  No trouble with reflux at this time.  No nausea or vomiting.   He is a Emergency planning/management officer in [place].   Past Medical History:  Diagnosis Date   Allergy    pt has a hx of taking allergy injections for 5 years. Last time he had injection was 2008.   Anxiety     Past Surgical History:  Procedure Laterality Date   LUMBAR LAMINECTOMY/DECOMPRESSION MICRODISCECTOMY N/A 05/29/2022   Procedure: Microdiscectomy Lumbar Four-Lumbar Five, Lumbar Five-Sacral One Left;  Surgeon: Jene Every, MD;  Location: [facility] OR;  Service: Orthopedics;  Laterality: N/A;    WISDOM TOOTH EXTRACTION  2018    Current Outpatient Medications  Medication Sig Dispense Refill   acetaminophen (TYLENOL) 500 MG tablet Take 1,000 mg by mouth every 6 (six) hours as needed for moderate pain.     Cholecalciferol (VITAMIN D) 50 MCG (2000 UT) tablet Take 2,000 Units by mouth daily.     fluticasone (FLONASE) 50 MCG/ACT nasal spray Place 2 sprays into both nostrils daily. (Patient taking differently: Place 2 sprays into both nostrils daily as needed for allergies.) 16 g 6   loratadine (CLARITIN) 10 MG tablet Take 10 mg by mouth daily.     phentermine (ADIPEX-P) 37.5 MG tablet TAKE ONE TABLET IN THE MORNING     No current facility-administered medications for this visit.    Allergies as of 07/24/2022 - Review Complete 07/24/2022  Allergen Reaction Noted   Penicillins Rash 02/25/2011    Family History  Problem Relation Age of Onset   Depression Mother    Hyperlipidemia Mother    Arthritis Father    COPD Father    Hyperlipidemia Father    Hypertension Father    Alcohol abuse Maternal Grandmother    Arthritis Maternal Grandmother    Arthritis Maternal Grandfather    Cancer Maternal Grandfather        hx of skin cancer and gastric cancer    Social History   Socioeconomic History   Marital status: Married    Spouse name: Not on file  Number of children: Not on file   Years of education: Not on file   Highest education level: Not on file  Occupational History   Not on file  Tobacco Use   Smoking status: Former    Types: Cigarettes    Quit date: 12/04/2011    Years since quitting: 10.6   Smokeless tobacco: Former    Quit date: 12/04/2011  Substance and Sexual Activity   Alcohol use: Yes    Alcohol/week: 3.0 standard drinks of alcohol    Types: 3 Standard drinks or equivalent per week   Drug use: No   Sexual activity: Yes    Partners: Female  Other Topics Concern   Not on file  Social History Narrative   Not on file   Social Determinants of Health    Financial Resource Strain: Not on file  Food Insecurity: Not on file  Transportation Needs: Not on file  Physical Activity: Not on file  Stress: Not on file  Social Connections: Not on file  Intimate Partner Violence: Not on file    Review of Systems: Gen: Denies any fever, chills, cold or flulike symptoms, presyncope, syncope. CV: Denies chest pain, heart palpitations. Resp: Denies shortness of breath, cough. GI: See HPI GU : Denies urinary burning, urinary frequency, urinary hesitancy MS: Denies joint pain. Derm: Denies rash. Psych: Denies depression, anxiety. Heme: See HPI  Physical Exam: BP 133/86 (BP Location: Right Arm, Patient Position: Sitting, Cuff Size: Normal)   Pulse 70   Temp 97.9 F (36.6 C) (Temporal)   Ht 5\' 9"  (1.753 m)   Wt 228 lb (103.4 kg)   SpO2 97%   BMI 33.67 kg/m  General:   Alert and oriented. Pleasant and cooperative. Well-nourished and well-developed.  Head:  Normocephalic and atraumatic. Eyes:  Without icterus, sclera clear and conjunctiva pink.  Ears:  Normal auditory acuity. Lungs:  Clear to auscultation bilaterally. No wheezes, rales, or rhonchi. No distress.  Heart:  S1, S2 present without murmurs appreciated.  Abdomen:  +BS, soft, non-tender and non-distended. No HSM noted. No guarding or rebound. No masses appreciated.  Rectal:  Deferred  Msk:  Symmetrical without gross deformities. Normal posture. Extremities:  Without edema. Neurologic:  Alert and  oriented x4;  grossly normal neurologically. Skin:  Intact without significant lesions or rashes. Psych:  Alert and cooperative. Normal mood and affect.    Assessment:  31 year old male with history of anxiety, presenting today at the request of Dr. Salvadore Farber for further evaluation of elevated liver enzymes.  Elevated LFTs: Mild elevation dates back at least to 2019.  Most recent labs 07/03/2022 with LFTs bumping up a bit-alk phos 133, AST 54, ALT 95.  Platelets have remained within  normal limits.  No abdominal imaging on file.  Infrequent use of alcohol.  No history of illicit drug use.  No over-the-counter supplements or herbal teas.  Uses Tylenol or ibuprofen as needed, previously taking more frequently due to torn ACL and back pain, but this has improved.  No family history of autoimmune conditions or liver problems.  He does report putting on 30 pounds over the last year due to decreased mobility in the setting of torn ACL and back pain, this is why he recently started phentermine. BMI currently 33.67.  Suspect we are likely dealing with fatty liver, but will complete additional workup to rule out autoimmune hepatitis, PBC, hemochromatosis, viral hepatitis.  Counseled on the importance of weight loss through diet and exercise as he is able.  Plan:  HFP, ANA, AMA, ASMA, immunoglobulins, iron panel, hepatitis A antibody total, hepatitis B surface antibody, hepatitis B surface antigen, hepatitis B core antibody total, hepatitis C antibody. US abdomen complete  Recommend 1-2# weight loss per week until ideal body weight through exercise & diet. Low fat/cholesterol diet.   Avoid sweets, sodas, fruit juices, sweetened beverages like tea, etc. Gradually increase exercise from 15 min daily up to 1 hr per day 5 days/week. Limit alcohol use. Avoid over-the-counter supplements and herbal teas. Limit tylenol.  Follow-up in 3 months.    Ermalinda Memos, PA-C [facility] 07/24/2022

## 2022-07-24 ENCOUNTER — Ambulatory Visit (INDEPENDENT_AMBULATORY_CARE_PROVIDER_SITE_OTHER): Payer: BC Managed Care – PPO | Admitting: Gastroenterology

## 2022-07-24 ENCOUNTER — Encounter: Payer: Self-pay | Admitting: Gastroenterology

## 2022-07-24 VITALS — BP 133/86 | HR 70 | Temp 97.9°F | Ht 69.0 in | Wt 228.0 lb

## 2022-07-24 DIAGNOSIS — R7989 Other specified abnormal findings of blood chemistry: Secondary | ICD-10-CM

## 2022-07-24 NOTE — Patient Instructions (Addendum)
Please have blood work completed at [facility].  We will arrange for you to have an ultrasound of your abdomen at [facility].   Recommend 1-2# weight loss per week until ideal body weight through exercise & diet. Low fat/cholesterol diet.   Avoid sweets, sodas, fruit juices, sweetened beverages like tea, etc. Gradually increase exercise from 15 min daily up to 1 hr per day 5 days/week. Limit alcohol use.  Avoid over-the-counter supplements and herbal teas.  No more than 2000 mg of Tylenol per 24 hours.  We will follow-up in 3 months.  It was good to meet you today!  Ermalinda Memos, PA-C [facility]

## 2022-07-25 ENCOUNTER — Encounter: Payer: Self-pay | Admitting: Gastroenterology

## 2022-07-25 LAB — HEPATIC FUNCTION PANEL
ALT: 72 IU/L — ABNORMAL HIGH (ref 0–44)
AST: 34 IU/L (ref 0–40)
Albumin: 4.4 g/dL (ref 4.3–5.2)
Alkaline Phosphatase: 123 IU/L — ABNORMAL HIGH (ref 44–121)
Bilirubin Total: 0.3 mg/dL (ref 0.0–1.2)
Bilirubin, Direct: 0.11 mg/dL (ref 0.00–0.40)
Total Protein: 7 g/dL (ref 6.0–8.5)

## 2022-07-27 LAB — HEPATITIS B CORE ANTIBODY, TOTAL: Hep B Core Total Ab: NEGATIVE

## 2022-07-27 LAB — HEPATITIS B SURFACE ANTIBODY,QUALITATIVE: Hep B Surface Ab, Qual: NONREACTIVE

## 2022-07-27 LAB — MITOCHONDRIAL ANTIBODIES: Mitochondrial Ab: <20 Units (ref 0.0–20.0)

## 2022-07-27 LAB — IGG, IGA, IGM
IgA/Immunoglobulin A, Serum: 246 mg/dL (ref 90–386)
IgG (Immunoglobin G), Serum: 1036 mg/dL (ref 603–1613)
IgM (Immunoglobulin M), Srm: 101 mg/dL (ref 20–172)

## 2022-07-27 LAB — IRON,TIBC AND FERRITIN PANEL
Ferritin: 206 ng/mL (ref 30–400)
Iron Saturation: 15 % (ref 15–55)
Iron: 50 ug/dL (ref 38–169)
Total Iron Binding Capacity: 343 ug/dL (ref 250–450)
UIBC: 293 ug/dL (ref 111–343)

## 2022-07-27 LAB — HEPATITIS C ANTIBODY: Hep C Virus Ab: NONREACTIVE

## 2022-07-27 LAB — HEPATITIS B SURFACE ANTIGEN: Hepatitis B Surface Ag: NEGATIVE

## 2022-07-27 LAB — ANTI-SMOOTH MUSCLE ANTIBODY, IGG: Smooth Muscle Ab: 5 Units (ref 0–19)

## 2022-07-27 LAB — ANA: Anti Nuclear Antibody (ANA): NEGATIVE

## 2022-07-27 LAB — HEPATITIS A ANTIBODY, TOTAL: hep A Total Ab: NEGATIVE

## 2022-08-01 ENCOUNTER — Encounter: Payer: Self-pay | Admitting: *Deleted

## 2022-08-13 ENCOUNTER — Ambulatory Visit (HOSPITAL_COMMUNITY)
Admission: RE | Admit: 2022-08-13 | Discharge: 2022-08-13 | Disposition: A | Discharge status: Home / Self Care | Payer: BC Managed Care – PPO | Source: Ambulatory Visit | Attending: Gastroenterology | Admitting: Gastroenterology

## 2022-08-13 ENCOUNTER — Encounter: Payer: Self-pay | Admitting: *Deleted

## 2022-08-13 DIAGNOSIS — R7989 Other specified abnormal findings of blood chemistry: Secondary | ICD-10-CM | POA: Diagnosis present

## 2022-10-21 ENCOUNTER — Encounter: Payer: Self-pay | Admitting: Gastroenterology
